# Patient Record
Sex: Female | Born: 1960 | Race: White | Hispanic: No | Smoking: Never smoker
Health system: Southern US, Community
[De-identification: ages and names within clinical notes are randomized; demographics above are authoritative.]

## PROBLEM LIST (undated history)

## (undated) DIAGNOSIS — I890 Lymphedema, not elsewhere classified: Secondary | ICD-10-CM

## (undated) DIAGNOSIS — L0390 Cellulitis, unspecified: Secondary | ICD-10-CM

## (undated) DIAGNOSIS — E039 Hypothyroidism, unspecified: Secondary | ICD-10-CM

## (undated) HISTORY — PX: ESOPHAGEAL DILATION: SHX303

## (undated) HISTORY — PX: TUBAL LIGATION: SHX77

## (undated) HISTORY — PX: OVARIAN CYST REMOVAL: SHX89

---

## 1998-01-29 ENCOUNTER — Encounter: Admission: RE | Admit: 1998-01-29 | Discharge: 1998-04-29 | Payer: Self-pay

## 1998-09-15 ENCOUNTER — Ambulatory Visit (HOSPITAL_COMMUNITY): Admission: RE | Admit: 1998-09-15 | Discharge: 1998-09-15 | Payer: Self-pay | Admitting: Gastroenterology

## 1998-09-15 ENCOUNTER — Encounter: Payer: Self-pay | Admitting: Gastroenterology

## 2002-02-23 ENCOUNTER — Other Ambulatory Visit: Admission: RE | Admit: 2002-02-23 | Discharge: 2002-02-23 | Payer: Self-pay | Admitting: *Deleted

## 2002-10-23 ENCOUNTER — Other Ambulatory Visit: Admission: RE | Admit: 2002-10-23 | Discharge: 2002-10-23 | Payer: Self-pay | Admitting: Family Medicine

## 2003-11-05 ENCOUNTER — Ambulatory Visit (HOSPITAL_COMMUNITY): Admission: RE | Admit: 2003-11-05 | Discharge: 2003-11-05 | Payer: Self-pay | Admitting: Gastroenterology

## 2004-09-25 ENCOUNTER — Ambulatory Visit (HOSPITAL_COMMUNITY): Admission: RE | Admit: 2004-09-25 | Discharge: 2004-09-25 | Payer: Self-pay | Admitting: Gastroenterology

## 2005-02-12 ENCOUNTER — Ambulatory Visit: Payer: Self-pay | Admitting: Internal Medicine

## 2005-03-03 ENCOUNTER — Other Ambulatory Visit: Admission: RE | Admit: 2005-03-03 | Discharge: 2005-03-03 | Payer: Self-pay | Admitting: Family Medicine

## 2005-03-08 ENCOUNTER — Ambulatory Visit (HOSPITAL_COMMUNITY): Admission: RE | Admit: 2005-03-08 | Discharge: 2005-03-08 | Payer: Self-pay | Admitting: Family Medicine

## 2006-08-03 ENCOUNTER — Encounter: Admission: RE | Admit: 2006-08-03 | Discharge: 2006-08-03 | Payer: Self-pay | Admitting: Family Medicine

## 2006-08-03 ENCOUNTER — Other Ambulatory Visit: Admission: RE | Admit: 2006-08-03 | Discharge: 2006-08-03 | Payer: Self-pay | Admitting: *Deleted

## 2007-07-28 ENCOUNTER — Ambulatory Visit (HOSPITAL_COMMUNITY): Admission: RE | Admit: 2007-07-28 | Discharge: 2007-07-28 | Payer: Self-pay | Admitting: Gastroenterology

## 2007-08-23 ENCOUNTER — Ambulatory Visit (HOSPITAL_COMMUNITY): Admission: RE | Admit: 2007-08-23 | Discharge: 2007-08-23 | Payer: Self-pay | Admitting: *Deleted

## 2008-01-17 ENCOUNTER — Other Ambulatory Visit: Admission: RE | Admit: 2008-01-17 | Discharge: 2008-01-17 | Payer: Self-pay | Admitting: *Deleted

## 2008-07-15 ENCOUNTER — Encounter: Admission: RE | Admit: 2008-07-15 | Discharge: 2008-07-15 | Payer: Self-pay | Admitting: Family Medicine

## 2008-07-18 ENCOUNTER — Encounter: Admission: RE | Admit: 2008-07-18 | Discharge: 2008-07-18 | Payer: Self-pay | Admitting: Family Medicine

## 2008-09-10 ENCOUNTER — Ambulatory Visit: Payer: Self-pay | Admitting: Vascular Surgery

## 2009-07-16 ENCOUNTER — Encounter: Admission: RE | Admit: 2009-07-16 | Discharge: 2009-07-16 | Payer: Self-pay | Admitting: Family Medicine

## 2009-07-25 ENCOUNTER — Encounter: Admission: RE | Admit: 2009-07-25 | Discharge: 2009-07-25 | Payer: Self-pay

## 2010-11-08 ENCOUNTER — Encounter: Payer: Self-pay | Admitting: Family Medicine

## 2011-03-02 NOTE — Consult Note (Signed)
VASCULAR SURGERY CONSULTATION   [name] [name], [name] [name]  DOB:  07/26/1961                                       09/10/2008  ZOXWR#:[id number]   The patient is a 50 year old female referred by Dr. Estill Bakes for  vascular surgery consultation regarding bilateral lower extremity edema.  This patient had 2 pregnancies 17 and 21 years ago, but only noticed  edema about 6 years ago, which started in her feet and was symmetrical  in nature.  It then progressed up into the calves and thighs.  She has  no history of deep vein thrombosis, thrombophlebitis, pulmonary  embolism, or any clotting problems.  She has been treated with elastic  compression stockings 2 years ago, fitted at a hospital supply store,  but these she stated were too tight, and she was not able to effectively  wear.  She has had diuretics tried without much success.  She tries to  elevate her legs at night on pillows.  Has not had a venous ultrasound,  she states.  There was some question that she might have lupus  erythematosus a few years ago, but that is not thought to be the case,  and she may have fibromyalgia.   PAST MEDICAL HISTORY:  1. Gastroesophageal reflux disease with history of esophageal      dilatations.  2. Negative for diabetes, hypertension, coronary artery disease, COPD,      or stroke, or hyperlipidemia.   PREVIOUS SURGERY:  Removal of ovarian cysts.   FAMILY HISTORY:  Positive for coronary artery disease.  Father having a  myocardial infarction at age 71.  Negative for CVA or diabetes.   SOCIAL HISTORY:  She is single and has 2 children.  She does not use  tobacco and drinks occasional wine.   REVIEW OF SYSTEMS:  Positive for weight gain, dyspnea on exertion,  negative for PND, orthopnea, or arrhythmias.  She has lower extremity  discomfort with walking and while in a supine position.  She has joint  and muscle pain with a rash to the lower extremities.   ALLERGIES:  None known.   PHYSICAL EXAM:  Blood pressure 139/88, heart rate 63, respirations 14.  Generally, she is a middle-aged female with obvious diffuse edema to  both lower extremities.  She is alert and oriented x3.  Neck is supple  with 3+ carotid pulses palpable.  No bruits are audible.  Neurologic  exam is normal.  No palpable adenopathy in the neck.  Chest is clear to  auscultation.  Cardiovascular exam reveals a regular rhythm with no  murmurs.  Her abdomen is soft and nontender with no masses.  She has 3+  femoral, popliteal, and posterior tibial pulses bilaterally.  There is  2+ edema throughout both lower extremities with feet being quite puffy.  She has some mild erythema medially.  No skin breakdown is noted.  Some  very early hyperpigmentation is noted.  No varicosities are present.  Homan sign is negative.   Venous duplex exam reveals no evidence of deep vein obstruction in  either leg.  She does have gross reflux in the left great saphenous vein  throughout, but the right great saphenous vein appears normal.   I do not think the etiology of her swelling is venous, even though she  does have reflux throughout her left greater saphenous system.  The  edema is equally as bad on the right as the left, and I think this is  more of a lymphedema picture.  I stressed to her the importance of  elevating the foot of the bed, rather than sleeping on pillows, so that  she will get good reduction of edema during the night.  This could be  difficult because of her GERD.  We have also fit her for long-leg  elastic compression stockings, which must be applied early in the  morning prior to arising to get good results.  Diuretics may also be  beneficial.  Otherwise, I have no suggestions.  Hopefully, this will  help this nice lady.   Quita Skye Hart [place], M.D.  Electronically Signed  JDL/MEDQ  D:  09/10/2008  T:  09/11/2008  Job:  [id number]   cc:   Areatha Keas, M.D.

## 2011-03-02 NOTE — Op Note (Signed)
NAMEFERMINA, [name] [name]             ACCOUNT NO.:  [id number]   MEDICAL RECORD NO.:  [id number]          PATIENT TYPE:  AMB   LOCATION:  ENDO                         FACILITY:  [facility]   PHYSICIAN:  James L. Malon Kindle., M.D.DATE OF BIRTH:  26-Aug-1961   DATE OF PROCEDURE:  08/23/2007  DATE OF DISCHARGE:                               OPERATIVE REPORT   PROCEDURE:  Esophagogastroduodenoscopy with Savary dilatation.   MEDICATIONS:  Fentanyl 65 mcg, Versed 6 mg IV, Cetacaine spray.   INDICATIONS:  Patient with a previous history of esophageal stricture  dilated to 48-French 3 years ago.  This procedure is done due to  increasing dysphagia.   DESCRIPTION OF PROCEDURE:  The procedure was discussed again just before  we did the procedure.  The patient was well aware that there was a  slight risk of perforation and bleeding.  She has been taking Prilosec  but only intermittently.   DESCRIPTION OF PROCEDURE:  The procedure was performed using  fluoroscopic guidance in the endoscopy unit.  The Pentax scope was  inserted and advanced.  The distal esophagus was reached.  In the very  part of the distal esophagus was a slight stricture.  Scope easily  passed.  Complete endoscopy was performed, and no other findings were  seen.  We then passed the Savary guide wire through the scope on the  greater curvature of the stomach and withdrew the scope over the guide  wire.  The patient's neck was extended.  Using fluoroscopic guidance, a  12.8, 14, 15 and 16-French dilator were passed.  No heme was seen with  the first three dilators.  There was some slight resistance and a small  amount of heme with a 16-French dilator.  The patient tolerated the  procedure well.  There were no immediate problems.   ASSESSMENT:  Esophageal dilatation to 16 mm.   PLAN:  Will follow the patient clinically and routine instructions.  Clear liquids for 4 to 5 hours.  Will start empirically on omeprazole 20  mg  daily.           ______________________________  Llana Aliment Malon Kindle., M.D.     Waldron Session  D:  08/23/2007  T:  08/23/2007  Job:  [id number]   cc:   Dario Guardian, M.D.  James L. Malon Kindle., M.D.

## 2011-03-02 NOTE — Op Note (Signed)
NAME:  [name] [name], ROEN             ACCOUNT NO.:  [id number]   MEDICAL RECORD NO.:  [id number]          PATIENT TYPE:  AMB   LOCATION:  ENDO                         FACILITY:  [facility]   PHYSICIAN:  Graylin Shiver, M.D.   DATE OF BIRTH:  1961-02-05   DATE OF PROCEDURE:  07/28/2007  DATE OF DISCHARGE:                               OPERATIVE REPORT   ESOPHAGOGASTRODUODENOSCOPY WITH ESOPHAGEAL FOREIGN BODY REMOVAL:   INDICATIONS FOR PROCEDURE:  The patient is a 50 year old female who has  a history of a distal esophageal stricture, which has been dilated in  the past.  She has also had a foreign body removed in the past.  The  patient was eating steak last night and got a piece of steak caught in  her esophagus.  She has been unable to swallow since.  She came to the  endoscopy unit at [facility] as an outpatient for endoscopy with meat  impaction removal.   Informed consent was obtained after explanation of risks of bleeding,  infection and perforation.   PREMEDICATION:  Fentanyl 50 mcg IV, Versed 5 mg IV.   PROCEDURE:  With the patient in the left lateral decubitus position, the  Pentax gastroscope was inserted into the oropharynx and passed into the  esophagus.  It was advanced down the esophagus to the lower portion of  the esophagus.  At this point a meat impaction was noted.  I initially  attempted to grasp this with a snare, but it had been there so long that  it fragmented and the meat impaction just seemed to flake apart.  I  therefore inserted a Lucina Mellow retrieval net and was able to grasp a portion  of the meat impaction and remove it.  I then had to reinsert the scope a  couple of times to finally get it all but was able to get all of the  meat impaction removed.  The scope was passed then down into the  duodenum.  The second portion and bulb of the duodenum looked normal.  The stomach had normal-appearing mucosa.  No lesions were seen on  retroflexion.  There was a hiatal  hernia.  The distal esophagus within  where the meat had been impacted for the past day was very irritated-  appearing, compatible with marked distal esophagitis.  There was a  distal esophageal stricture noted at 34 cm but this was not severe and  did not prevent passage of the scope.  She tolerated the procedure well  without complications.   IMPRESSION:  1. Meat impaction.  2. Hiatal hernia.  3. Distal esophageal stricture.   PLAN:  I would recommend that this patient be on a proton pump inhibitor  for the next few weeks.  I will recommend to her that she take Prilosec  20 mg daily.  I would then have her follow up as an outpatient with Dr.  [name] for repeat endoscopy with dilation.           ______________________________  Graylin Shiver, M.D.     SFG/MEDQ  D:  07/28/2007  T:  07/29/2007  Job:  [id number]   cc:   Fayrene Fearing L. Malon Kindle., M.D.  Fax: [phone]

## 2011-03-02 NOTE — Procedures (Signed)
DUPLEX DEEP VENOUS EXAM - LOWER EXTREMITY   INDICATION:  Bilateral lower extremity edema.   HISTORY:  Edema:  Yes.  Trauma/Surgery:  No.  Pain:  Yes.  PE:  No.  Previous DVT:  No.  Anticoagulants:  Other:   DUPLEX EXAM:                CFV   SFV   PopV  PTV    GSV                R  L  R  L  R  L  R   L  R  L  Thrombosis    0  0  0  0  0  0  0   0  0  0  Spontaneous   +  +  +  +  +  +  +   +  +  +  Phasic        +  +  +  +  +  +  +   +  +  +  Augmentation  +  +  +  +  +  +  +   +  +  +  Compressible  +  +  +  +  +  +  +   +  +  +  Competent     +  +  +  +  +  +  +   +  +  +   Legend:  + - yes  o - no  p - partial  D - decreased   IMPRESSION:  1. No evidence of DVT noted in bilateral legs.  2. Reflux noted in the entire left greater saphenous vein.    _____________________________  Quita Skye. Hart [place], M.D.   MG/MEDQ  D:  09/10/2008  T:  09/10/2008  Job:  [id number]

## 2011-03-05 NOTE — Op Note (Signed)
[name], [name]             ACCOUNT NO.:  [id number]   MEDICAL RECORD NO.:  [id number]          PATIENT TYPE:  AMB   LOCATION:  ENDO                         FACILITY:  [facility]   PHYSICIAN:  James L. Malon Kindle., M.D.DATE OF BIRTH:  Jun 09, 1961   DATE OF PROCEDURE:  09/25/2004  DATE OF DISCHARGE:                                 OPERATIVE REPORT   PROCEDURE:  Esophagogastroduodenoscopy with Savary dilatation.   MEDICATIONS GIVEN:  Fentanyl 50 mcg, Versed 6 mg IV, Cetacaine spray.   INDICATIONS FOR PROCEDURE:  The patient has had an esophageal stricture for  sometime, this has been getting progressively worse.  This was demonstrated  initially back in 1998 but has been getting worse recently.   DESCRIPTION OF PROCEDURE:  The procedure was explained to the patient in the  office including the risks and benefits.  I did go over the risks with her  again just prior to her sedation.  The scope was inserted and advanced.  The  stomach and pylorus were passed.  The duodenum including the bulb and second  duodenum were normal.  The scope was withdrawn back into the stomach.  The  fundus and cardia were seen and were normal.  The patient had approximately  a 3-4 cm hiatal hernia and there was a stricture right at the junction of  the hiatal hernia and esophagus.  We then went back into the stomach, placed  the Savary guide-wire under fluoroscopic guidance, the patient's neck was  extended, we passed a 39, 42, 45, and 48 Savary dilators.  There was  resistance and heme with the 48 dilator.  The dilator and guide-wire were  withdrawn.  The patient tolerated the procedure well, there were no  immediate complications.   ASSESSMENT:  Esophageal stricture dilated to 48 [place].   PLAN:  Routine post dilatation orders, recommend Prilosec OTC daily, and see  back in the office in 4-6 weeks.       JLE/MEDQ  D:  09/25/2004  T:  09/25/2004  Job:  [id number]   cc:   Meredith Staggers, M.D.  [address], [address]  [place]  [place] [address]  Fax: [phone]

## 2011-03-05 NOTE — Op Note (Signed)
NAME:  [name] [name], [name] [name]                       ACCOUNT NO.:  [id number]   MEDICAL RECORD NO.:  [id number]                   PATIENT TYPE:  AMB   LOCATION:  ENDO                                 FACILITY:  [facility]   PHYSICIAN:  Bernette Redbird, M.D.                DATE OF BIRTH:  11/14/1960   DATE OF PROCEDURE:  11/05/2003  DATE OF DISCHARGE:                                 OPERATIVE REPORT   PROCEDURE:  Upper endoscopy with removal of foreign body.   INDICATIONS:  This is a 50 year old female with a long-standing history of  esophageal dysphagia symptoms status post a previous food impaction about  ten years ago with her most recent esophageal dilatation performed by Dr.  [name] about six years ago.   In the past month or so, she has had quite a bit of severe heartburn for  which she has been using over-the-counter remedies.  She has also been  having problems with very transient food impactions.  However, last night  while eating steak, a piece of food got caught in her esophagus and could  not be dislodged either way and has persisted up tot he present time.   FINDINGS:  Successful removal of food impaction.  Mild distal esophagitis  above small hiatal hernia.  Probable esophageal ring, but unable to  visualize discretely.   PROCEDURE:  The risks of the procedure were reviewed briefly with the  patient and she provided written consent, having coming as an outpatient to  the endoscopy unit.  Sedation was Fentanyl 100 mg and Versed 10 mg IV prior  to and during the course of the procedure without clinical instability.  The  Olympus standard videoendoscope was passed under direct vision.  The vocal  cords looked entirely normal.  The esophagus was readily entered.  The  proximal esophageal mucosa was normal.  Distally there was a bolus of meat  impacted in the distal esophagus.  Attempts to engage this with the [facility]  retrieval basket were unsuccessful because it was so tightly wedged  in the  esophagus I could not get the basket around it.  I then attempted to use the  snare but encountered the same problem.  I then went to the tripod forceps  and removed approximately 4 or 5 pieces of the meat, which would pull off  each time the forceps were engaged.  Each time none of these were removed  out through the patient's mouth so the patient was re-endoscoped under  direct vision.  Finally, when I went down the last time, the remaining food  bolus had slipped spontaneously into the stomach where a large bolus of meat  was noted.  The distal esophageal mucosa was slightly inflamed although no  frank erosions or ulcerations were observed.  This was felt to be consistent  with stasis esophagitis.  I did see, grasp very transiently, an esophageal  ring but no persistent or  well-defined ring could be visualized.  There was  a good deal of distal esophageal spasm, and hence if the ring was present it  would have been hard to visualize.   There was a small hiatal hernia present.   The gastric mucosa was unremarkable, without evidence of gastritis,  erosions, ulcers, problems or masses including retroflexed view of the  cardia, which showed the small hiatal hernia from its inferior perspective.  The pylorus, duodenal bulb and second duodenum looked normal.   The scope was removed from the patient.  She tolerated the procedure well  and there were no apparent complications.   IMPRESSION:  1. Successful removal of esophageal foreign body (food impaction) (935.1).  2. Mild distal esophagitis.  3. Small hiatal hernia and probable esophageal ring.  4. Reflux symptomatology, without significant reflux esophagitis being     identified.   PLAN:  1. Initiate Prevacid Solu-Tabs once a day (samples to be provided).  2. Office followup with Dr. [name] in the next several weeks, at which time     a decision regarding ongoing medical therapy and/or esophageal dilatation     could be  made.                                               Bernette Redbird, M.D.    RB/MEDQ  D:  11/05/2003  T:  11/06/2003  Job:  [id number]   cc:   Fayrene Fearing L. Malon Kindle., M.D.  [address], [address]  [place]  [place] [address]  Fax: [phone]   Meredith Staggers, M.D.  [address], [address]  [place]  [place] [address]  Fax: [phone]

## 2011-07-22 ENCOUNTER — Other Ambulatory Visit: Payer: Self-pay | Admitting: Internal Medicine

## 2011-07-22 DIAGNOSIS — Z1231 Encounter for screening mammogram for malignant neoplasm of breast: Secondary | ICD-10-CM

## 2011-07-29 ENCOUNTER — Other Ambulatory Visit (HOSPITAL_COMMUNITY)
Admission: RE | Admit: 2011-07-29 | Discharge: 2011-07-29 | Disposition: A | Discharge status: Home / Self Care | Payer: Managed Care, Other (non HMO) | Source: Ambulatory Visit | Attending: Internal Medicine | Admitting: Internal Medicine

## 2011-07-29 DIAGNOSIS — Z01419 Encounter for gynecological examination (general) (routine) without abnormal findings: Secondary | ICD-10-CM | POA: Insufficient documentation

## 2011-08-03 ENCOUNTER — Ambulatory Visit
Admission: RE | Admit: 2011-08-03 | Discharge: 2011-08-03 | Disposition: A | Discharge status: Home / Self Care | Payer: Managed Care, Other (non HMO) | Source: Ambulatory Visit | Attending: Internal Medicine | Admitting: Internal Medicine

## 2011-08-03 DIAGNOSIS — Z1231 Encounter for screening mammogram for malignant neoplasm of breast: Secondary | ICD-10-CM

## 2012-03-15 ENCOUNTER — Other Ambulatory Visit: Payer: Self-pay | Admitting: Internal Medicine

## 2012-03-20 ENCOUNTER — Ambulatory Visit
Admission: RE | Admit: 2012-03-20 | Discharge: 2012-03-20 | Disposition: A | Discharge status: Home / Self Care | Payer: Managed Care, Other (non HMO) | Source: Ambulatory Visit | Attending: Internal Medicine | Admitting: Internal Medicine

## 2012-03-22 ENCOUNTER — Other Ambulatory Visit: Payer: Managed Care, Other (non HMO)

## 2012-03-23 ENCOUNTER — Other Ambulatory Visit: Payer: Self-pay | Admitting: Gastroenterology

## 2012-03-23 DIAGNOSIS — R1013 Epigastric pain: Secondary | ICD-10-CM

## 2012-03-23 DIAGNOSIS — R110 Nausea: Secondary | ICD-10-CM

## 2012-03-27 ENCOUNTER — Other Ambulatory Visit (HOSPITAL_COMMUNITY): Payer: Managed Care, Other (non HMO)

## 2012-03-27 ENCOUNTER — Ambulatory Visit (INDEPENDENT_AMBULATORY_CARE_PROVIDER_SITE_OTHER): Payer: Managed Care, Other (non HMO) | Admitting: Surgery

## 2012-03-31 ENCOUNTER — Encounter (HOSPITAL_COMMUNITY)
Admission: RE | Admit: 2012-03-31 | Discharge: 2012-03-31 | Disposition: A | Discharge status: Home / Self Care | Payer: Managed Care, Other (non HMO) | Source: Ambulatory Visit | Attending: Gastroenterology | Admitting: Gastroenterology

## 2012-03-31 DIAGNOSIS — R1013 Epigastric pain: Secondary | ICD-10-CM | POA: Insufficient documentation

## 2012-03-31 DIAGNOSIS — R110 Nausea: Secondary | ICD-10-CM

## 2012-03-31 MED ORDER — TECHNETIUM TC 99M MEBROFENIN IV KIT
5.5000 | PACK | Freq: Once | INTRAVENOUS | Status: AC | PRN
Start: 2012-03-31 — End: 2012-03-31
  Administered 2012-03-31: 6 via INTRAVENOUS

## 2012-09-25 ENCOUNTER — Other Ambulatory Visit: Payer: Self-pay | Admitting: Internal Medicine

## 2012-09-25 DIAGNOSIS — Z1231 Encounter for screening mammogram for malignant neoplasm of breast: Secondary | ICD-10-CM

## 2012-10-30 ENCOUNTER — Ambulatory Visit
Admission: RE | Admit: 2012-10-30 | Discharge: 2012-10-30 | Disposition: A | Discharge status: Home / Self Care | Payer: Managed Care, Other (non HMO) | Source: Ambulatory Visit | Attending: Internal Medicine | Admitting: Internal Medicine

## 2012-10-30 DIAGNOSIS — Z1231 Encounter for screening mammogram for malignant neoplasm of breast: Secondary | ICD-10-CM

## 2012-12-18 ENCOUNTER — Other Ambulatory Visit (HOSPITAL_COMMUNITY): Payer: Self-pay | Admitting: Internal Medicine

## 2012-12-18 ENCOUNTER — Ambulatory Visit (HOSPITAL_COMMUNITY)
Admission: RE | Admit: 2012-12-18 | Discharge: 2012-12-18 | Disposition: A | Discharge status: Home / Self Care | Payer: Managed Care, Other (non HMO) | Source: Ambulatory Visit | Attending: Internal Medicine | Admitting: Internal Medicine

## 2012-12-18 DIAGNOSIS — M25569 Pain in unspecified knee: Secondary | ICD-10-CM | POA: Insufficient documentation

## 2012-12-18 DIAGNOSIS — M25561 Pain in right knee: Secondary | ICD-10-CM

## 2012-12-18 DIAGNOSIS — M7989 Other specified soft tissue disorders: Secondary | ICD-10-CM

## 2012-12-18 DIAGNOSIS — R599 Enlarged lymph nodes, unspecified: Secondary | ICD-10-CM | POA: Insufficient documentation

## 2012-12-18 DIAGNOSIS — M79609 Pain in unspecified limb: Secondary | ICD-10-CM

## 2012-12-18 NOTE — Progress Notes (Signed)
Right:  No evidence of DVT, superficial thrombosis, or Baker's cyst.  Left:  Negative for DVT in the common femoral vein.  

## 2012-12-22 ENCOUNTER — Emergency Department (HOSPITAL_COMMUNITY)
Admission: EM | Admit: 2012-12-22 | Discharge: 2012-12-22 | Disposition: A | Discharge status: Home / Self Care | Payer: Managed Care, Other (non HMO) | Attending: Emergency Medicine | Admitting: Emergency Medicine

## 2012-12-22 ENCOUNTER — Encounter (HOSPITAL_COMMUNITY): Payer: Self-pay

## 2012-12-22 DIAGNOSIS — E876 Hypokalemia: Secondary | ICD-10-CM

## 2012-12-22 DIAGNOSIS — L02419 Cutaneous abscess of limb, unspecified: Secondary | ICD-10-CM | POA: Insufficient documentation

## 2012-12-22 DIAGNOSIS — Z79899 Other long term (current) drug therapy: Secondary | ICD-10-CM | POA: Insufficient documentation

## 2012-12-22 DIAGNOSIS — L0390 Cellulitis, unspecified: Secondary | ICD-10-CM

## 2012-12-22 DIAGNOSIS — Z792 Long term (current) use of antibiotics: Secondary | ICD-10-CM | POA: Insufficient documentation

## 2012-12-22 DIAGNOSIS — R609 Edema, unspecified: Secondary | ICD-10-CM | POA: Insufficient documentation

## 2012-12-22 DIAGNOSIS — D649 Anemia, unspecified: Secondary | ICD-10-CM | POA: Insufficient documentation

## 2012-12-22 DIAGNOSIS — L03119 Cellulitis of unspecified part of limb: Secondary | ICD-10-CM | POA: Insufficient documentation

## 2012-12-22 DIAGNOSIS — Z8679 Personal history of other diseases of the circulatory system: Secondary | ICD-10-CM | POA: Insufficient documentation

## 2012-12-22 LAB — BASIC METABOLIC PANEL
CO2: 26 mEq/L (ref 19–32)
Calcium: 9.2 mg/dL (ref 8.4–10.5)
Chloride: 102 mEq/L (ref 96–112)
Creatinine, Ser: 0.69 mg/dL (ref 0.50–1.10)
GFR calc Af Amer: >90 mL/min (ref >90)
GFR calc non Af Amer: >90 mL/min (ref >90)
Glucose, Bld: 99 mg/dL (ref 70–99)
Potassium: 2.8 mEq/L — ABNORMAL LOW (ref 3.5–5.1)
Sodium: 141 mEq/L (ref 135–145)

## 2012-12-22 LAB — CBC WITH DIFFERENTIAL/PLATELET
Basophils Absolute: 0.1 10*3/uL (ref 0.0–0.1)
Eosinophils Absolute: 0.4 10*3/uL (ref 0.0–0.7)
Eosinophils Relative: 6 % — ABNORMAL HIGH (ref 0–5)
Lymphocytes Relative: 19 % (ref 12–46)
Lymphs Abs: 1.4 10*3/uL (ref 0.7–4.0)
Monocytes Relative: 7 % (ref 3–12)
RBC: 4.51 MIL/uL (ref 3.87–5.11)
RDW: 16.4 % — ABNORMAL HIGH (ref 11.5–15.5)
WBC: 7.6 10*3/uL (ref 4.0–10.5)

## 2012-12-22 MED ORDER — HYDROCODONE-ACETAMINOPHEN 5-325 MG PO TABS: 2.0000 | ORAL_TABLET | ORAL | Status: DC | PRN

## 2012-12-22 MED ORDER — POTASSIUM CHLORIDE CRYS ER 20 MEQ PO TBCR
40.0000 meq | EXTENDED_RELEASE_TABLET | Freq: Once | ORAL | Status: AC
  Administered 2012-12-22: 40 meq via ORAL
  Filled 2012-12-22: qty 2

## 2012-12-22 MED ORDER — SULFAMETHOXAZOLE-TRIMETHOPRIM 800-160 MG PO TABS: 1.0000 | ORAL_TABLET | Freq: Two times a day (BID) | ORAL | Status: DC

## 2012-12-22 MED ORDER — NAPROXEN 500 MG PO TABS: 500.0000 mg | ORAL_TABLET | Freq: Two times a day (BID) | ORAL | Status: DC

## 2012-12-22 MED ORDER — POTASSIUM CHLORIDE CRYS ER 20 MEQ PO TBCR: 20.0000 meq | EXTENDED_RELEASE_TABLET | Freq: Every day | ORAL | Status: DC

## 2012-12-22 MED ORDER — VANCOMYCIN HCL IN DEXTROSE 1-5 GM/200ML-% IV SOLN
1000.0000 mg | Freq: Once | INTRAVENOUS | Status: AC
  Administered 2012-12-22: 1000 mg via INTRAVENOUS
  Filled 2012-12-22: qty 200

## 2012-12-22 NOTE — ED Provider Notes (Signed)
Medical screening examination/treatment/procedure(s) were conducted as a shared visit with non-physician practitioner(s) and myself.  I personally evaluated the patient during the encounter  Please see my separate respective documentation pertaining to this patient encounter   Brian D Miller, MD 12/22/12 2013 

## 2012-12-22 NOTE — ED Notes (Signed)
Pt here due to worsening cellulitis in right leg. Has been on antibiotics without improvement. States leg is twice the size as it was two days ago. Redness and swelling noted to right leg. No fever.

## 2012-12-22 NOTE — ED Notes (Signed)
Rt. Lower leg became swollen and red on Monday, pt. Came to [place] and was negative for blood clot. She was placed on Augmentin and pt. Believes the leg is becoming worse.  Leg is swollen and pink, and tight It is not warm to touch,  Pt. Did take ibuprofen this morning at 0700,

## 2012-12-22 NOTE — ED Provider Notes (Signed)
History     CSN: [id number]  Arrival date & time 12/22/12  1218   First MD Initiated Contact with Patient 12/22/12 1236      Chief Complaint  Patient presents with  . Recurrent Skin Infections    (Consider location/radiation/quality/duration/timing/severity/associated sxs/prior treatment) HPI Comments: Pt is a 52 y/o Caucasian F, with PMHx of lymphedema, c/o swelling and discomfort to right leg x 5 days. Pt describes pain as an achy, constant pain, rating pain 6-7/10, that has progressively gotten worse with radiation to right inner thigh, nothing makes the pain better or worse.  Pt reported that swelling began 5 days ago with redness beginning 4 days ago.  Pt denies risk factors for DVT. Denies ever having an episode like this. Pt reported having an ultrasound performed to right leg. I have read the results of the tests, no blood clots were found.     The history is provided by the patient.    History reviewed. No pertinent past medical history.  History reviewed. No pertinent past surgical history.  No family history on file.  History  Substance Use Topics  . Smoking status: Never Smoker   . Smokeless tobacco: Not on file  . Alcohol Use: Yes    OB History   Grav Para Term Preterm Abortions TAB SAB Ect Mult Living                  Review of Systems  Skin: Positive for color change.       Swelling and inflammation to right lower leg  All other systems reviewed and are negative.    Allergies  Spinach  Home Medications   Current Outpatient Rx  Name  Route  Sig  Dispense  Refill  . amoxicillin-clavulanate (AUGMENTIN) 875-125 MG per tablet   Oral   Take 1 tablet by mouth 2 (two) times daily.         . Cholecalciferol (VITAMIN D) 2000 UNITS tablet   Oral   Take 4,000 Units by mouth daily.         . furosemide (LASIX) 40 MG tablet   Oral   Take 40 mg by mouth daily.         Marland Kitchen ibuprofen (ADVIL,MOTRIN) 200 MG tablet   Oral   Take 400-600 mg by mouth  every 6 (six) hours as needed for pain.         Marland Kitchen omeprazole (PRILOSEC) 20 MG capsule   Oral   Take 20 mg by mouth daily.         . theophylline (THEODUR) 100 MG 12 hr tablet   Oral   Take 100 mg by mouth daily.         Marland Kitchen thyroid (ARMOUR) 120 MG tablet   Oral   Take 120 mg by mouth once a week. Take 1 tablet on sunday         . thyroid (ARMOUR) 90 MG tablet   Oral   Take 90 mg by mouth daily. Takes 90mg  tablet once daily Monday through saturday         . HYDROcodone-acetaminophen (NORCO/VICODIN) 5-325 MG per tablet   Oral   Take 2 tablets by mouth every 4 (four) hours as needed for pain.   10 tablet   0   . naproxen (NAPROSYN) 500 MG tablet   Oral   Take 1 tablet (500 mg total) by mouth 2 (two) times daily with a meal.   30 tablet   0   .  potassium chloride SA (K-DUR,KLOR-CON) 20 MEQ tablet   Oral   Take 1 tablet (20 mEq total) by mouth daily.   10 tablet   0   . sulfamethoxazole-trimethoprim (BACTRIM DS,SEPTRA DS) 800-160 MG per tablet   Oral   Take 1 tablet by mouth 2 (two) times daily. One po bid x 10 days   20 tablet   0     BP 156/91  Pulse 89  Temp(Src) 97.5 F (36.4 C) (Oral)  SpO2 100%  LMP 12/17/2012  Physical Exam  Constitutional: She appears well-developed and well-nourished.  Cardiovascular: Normal rate, regular rhythm and normal heart sounds.   No murmur heard. 2+ pitting edema to knee on right lower leg Palpable peripheral pulses b/l   Pulmonary/Chest: Effort normal and breath sounds normal.  Musculoskeletal: Normal range of motion.  Asymmetry to lower legs - circumference of right lower leg greater than left lower leg Normal sensation b/l  Skin: Skin is warm. There is erythema.     Circumferential erythema, swelling, inflammation, and warmth to right lower leg from ankle to just below knee.       ED Course  Procedures (including critical care time)  Labs Reviewed  CBC WITH DIFFERENTIAL - Abnormal; Notable for the  following:    Hemoglobin 10.8 (*)    HCT 33.9 (*)    MCV 75.2 (*)    MCH 23.9 (*)    RDW 16.4 (*)    Eosinophils Relative 6 (*)    All other components within normal limits  BASIC METABOLIC PANEL - Abnormal; Notable for the following:    Potassium 2.8 (*)    All other components within normal limits   No results found.   1. Cellulitis   2. Hypokalemia   3. Anemia       MDM  Pt diagnosed with cellulitis infection to right lower leg.  Pt started on IV 1 gram Vancomycin in ED.  Pt is normotensive, non-tachycardic, alert and oriented x 3, afebrile, with happy affect. WBC within normal limits.  Spoke with pt and pt's primary care physician about plan of care, both parties agreed and understood. Outlined cellulitis with pen so pt is able to keep track of infection and knows to return to ED if infection is to worsen.           Raymon Mutton, PA-C 12/22/12 1555

## 2012-12-22 NOTE — ED Provider Notes (Signed)
Pt with hx of RLE pain that started on Monday - had been evaluated by her family doctor, Dr. Selena Batten. He had ordered a ultrasound which showed no signs of deep venous thrombosis. On Wednesday she noticed that the leg was more swollen, it is the continued to swell and become red and hot.  On exam the patient has tenderness, swelling, asymmetry with right greater than left lower extremity. It is warm to the touch, it extends over the majority of the right lower extremity below the knee and above the ankle. He does have a circumferential nature to it. The patient has clear heart and lung sounds, no tachycardia, she is tolerating by mouth and is not appear systemically ill. We'll obtain blood work as baseline blood counts and kidney function, gram of vancomycin, the patient feels well and agrees with going home to followup with Dr. Selena Batten or to return should her symptoms worsen. Cellulitic area has been on prior to discharge.  Medical screening examination/treatment/procedure(s) were conducted as a shared visit with non-physician practitioner(s) and myself.  I personally evaluated the patient during the encounter    Vida Roller, MD 12/22/12 [phone]

## 2012-12-22 NOTE — ED Notes (Signed)
Pt given discharge paperwork; no additional questions by pt about discharge; pt verbalized understanding of f/u, d/c and medications; VSS; pt ambulatory on discharge

## 2013-01-05 ENCOUNTER — Emergency Department (HOSPITAL_COMMUNITY)
Admission: EM | Admit: 2013-01-05 | Discharge: 2013-01-06 | Disposition: A | Discharge status: Home / Self Care | Payer: Managed Care, Other (non HMO) | Attending: Emergency Medicine | Admitting: Emergency Medicine

## 2013-01-05 ENCOUNTER — Encounter (HOSPITAL_COMMUNITY): Payer: Self-pay | Admitting: *Deleted

## 2013-01-05 DIAGNOSIS — L0390 Cellulitis, unspecified: Secondary | ICD-10-CM

## 2013-01-05 DIAGNOSIS — L02419 Cutaneous abscess of limb, unspecified: Secondary | ICD-10-CM | POA: Insufficient documentation

## 2013-01-05 DIAGNOSIS — Z862 Personal history of diseases of the blood and blood-forming organs and certain disorders involving the immune mechanism: Secondary | ICD-10-CM | POA: Insufficient documentation

## 2013-01-05 DIAGNOSIS — Z79899 Other long term (current) drug therapy: Secondary | ICD-10-CM | POA: Insufficient documentation

## 2013-01-05 DIAGNOSIS — Z8639 Personal history of other endocrine, nutritional and metabolic disease: Secondary | ICD-10-CM | POA: Insufficient documentation

## 2013-01-05 DIAGNOSIS — E039 Hypothyroidism, unspecified: Secondary | ICD-10-CM | POA: Insufficient documentation

## 2013-01-05 DIAGNOSIS — R21 Rash and other nonspecific skin eruption: Secondary | ICD-10-CM | POA: Insufficient documentation

## 2013-01-05 HISTORY — DX: Lymphedema, not elsewhere classified: I89.0

## 2013-01-05 HISTORY — DX: Hypothyroidism, unspecified: E03.9

## 2013-01-05 LAB — BASIC METABOLIC PANEL
BUN: 15 mg/dL (ref 6–23)
CO2: 24 mEq/L (ref 19–32)
Calcium: 9.3 mg/dL (ref 8.4–10.5)
Creatinine, Ser: 1.02 mg/dL (ref 0.50–1.10)

## 2013-01-05 LAB — CBC WITH DIFFERENTIAL/PLATELET
Basophils Absolute: 0.1 10*3/uL (ref 0.0–0.1)
Basophils Relative: 1 % (ref 0–1)
Eosinophils Relative: 8 % — ABNORMAL HIGH (ref 0–5)
HCT: 35 % — ABNORMAL LOW (ref 36.0–46.0)
Hemoglobin: 11.1 g/dL — ABNORMAL LOW (ref 12.0–15.0)
MCHC: 31.7 g/dL (ref 30.0–36.0)
MCV: 76.6 fL — ABNORMAL LOW (ref 78.0–100.0)
Monocytes Absolute: 0.5 10*3/uL (ref 0.1–1.0)
Monocytes Relative: 7 % (ref 3–12)
RDW: 17 % — ABNORMAL HIGH (ref 11.5–15.5)

## 2013-01-05 NOTE — ED Notes (Signed)
Pt c/o right lower leg swelling and reddness, recently hospitalized for cellulitis.  Seen at PCP on Monday and was given po abx.  Tonight, swelling is worsened and leg is painful

## 2013-01-06 MED ORDER — CLINDAMYCIN HCL 150 MG PO CAPS: ORAL_CAPSULE | ORAL | Status: DC

## 2013-01-06 MED ORDER — POTASSIUM CHLORIDE CRYS ER 20 MEQ PO TBCR: 40.0000 meq | EXTENDED_RELEASE_TABLET | Freq: Once | ORAL | Status: DC

## 2013-01-06 NOTE — ED Notes (Signed)
Rx x 1.  Pt voiced understanding to f/u with PCP on Monday, start new rx and d/c Bactrim, and return for worsening condition.

## 2013-01-06 NOTE — ED Provider Notes (Signed)
History     CSN: [id number]  Arrival date & time 01/05/13  2112   First MD Initiated Contact with Patient 01/05/13 2332      Chief Complaint  Patient presents with  . Cellulitis    (Consider location/radiation/quality/duration/timing/severity/associated sxs/prior treatment) HPI Comments: Patient is a 52 y/o F with reoccurring cellulitis infection. Patient reported that pain, increased swelling, and inflammation started on Wednesday (01/03/2013). Patient reported that skin has gotten "shiny," has a scaly appearance to right leg. Patient described that discomfort is a mild dull, achy pain that is constant with radiation up the right leg. Patient reported seeing Dr. Selena Batten on Monday (01/01/2013) and Dr. Selena Batten prescribed her Bactrim. Denied fever, chills, weeping to right leg, chest pain, shortness of breathe, calf pain.    The history is provided by the patient.    Past Medical History  Diagnosis Date  . Lymphedema   . Hypothyroid     Past Surgical History  Procedure Laterality Date  . Ovarian cyst removal      History reviewed. No pertinent family history.  History  Substance Use Topics  . Smoking status: Never Smoker   . Smokeless tobacco: Not on file  . Alcohol Use: Yes    OB History   Grav Para Term Preterm Abortions TAB SAB Ect Mult Living                  Review of Systems  Constitutional: Negative for fever and chills.  Respiratory: Negative for chest tightness and shortness of breath.   Cardiovascular: Negative for chest pain.  Gastrointestinal: Negative for nausea and abdominal pain.  Musculoskeletal: Negative for back pain.  Skin: Positive for color change and rash.       Swelling and inflammation to right leg.  10 Systems reviewed and are negative for acute change except as noted in the HPI.   Allergies  Spinach  Home Medications   Current Outpatient Rx  Name  Route  Sig  Dispense  Refill  . amoxicillin-clavulanate (AUGMENTIN) 875-125 MG per tablet  Oral   Take 1 tablet by mouth 2 (two) times daily.         . Cholecalciferol (VITAMIN D) 2000 UNITS tablet   Oral   Take 4,000 Units by mouth daily.         . furosemide (LASIX) 40 MG tablet   Oral   Take 40 mg by mouth daily.         Marland Kitchen ibuprofen (ADVIL,MOTRIN) 200 MG tablet   Oral   Take 400-600 mg by mouth every 6 (six) hours as needed for pain.         . naproxen (NAPROSYN) 500 MG tablet   Oral   Take 1 tablet (500 mg total) by mouth 2 (two) times daily with a meal.   30 tablet   0   . omeprazole (PRILOSEC) 20 MG capsule   Oral   Take 20 mg by mouth daily.         Marland Kitchen sulfamethoxazole-trimethoprim (BACTRIM DS) 800-160 MG per tablet   Oral   Take 1 tablet by mouth 2 (two) times daily. Additional 7 day course for lower extremity cellulitis, beginning 01/01/13         . theophylline (THEODUR) 100 MG 12 hr tablet   Oral   Take 100 mg by mouth daily.         Marland Kitchen thyroid (ARMOUR) 120 MG tablet   Oral   Take 120 mg by mouth once a  week. Take 1 tablet on sunday         . thyroid (ARMOUR) 90 MG tablet   Oral   Take 90 mg by mouth daily. Takes 90mg  tablet once daily Monday through saturday         . clindamycin (CLEOCIN) 150 MG capsule      Takes 2 (two) tablets by mouth 3 (three) times daily for 10 days.   60 capsule   0   . HYDROcodone-acetaminophen (NORCO/VICODIN) 5-325 MG per tablet   Oral   Take 2 tablets by mouth every 4 (four) hours as needed for pain.   10 tablet   0   . potassium chloride SA (K-DUR,KLOR-CON) 20 MEQ tablet   Oral   Take 1 tablet (20 mEq total) by mouth daily.   10 tablet   0   . sulfamethoxazole-trimethoprim (BACTRIM DS,SEPTRA DS) 800-160 MG per tablet   Oral   Take 1 tablet by mouth 2 (two) times daily. One po bid x 10 days   20 tablet   0     BP 140/74  Pulse 76  Temp(Src) 97.5 F (36.4 C) (Oral)  Resp 16  SpO2 100%  LMP 12/17/2012  Physical Exam  Nursing note and vitals reviewed. Constitutional: She appears  well-developed and well-nourished. No distress.  Neck: Normal range of motion. Neck supple.  Cardiovascular: Normal rate, regular rhythm, normal heart sounds and intact distal pulses.  Exam reveals no friction rub.   No murmur heard. Peripheral pulses palpable  Pulmonary/Chest: Effort normal and breath sounds normal. She has no wheezes. She has no rales.  Abdominal: Soft. Bowel sounds are normal. She exhibits no distension. There is no tenderness.  Musculoskeletal: Normal range of motion. She exhibits tenderness.  Tenderness upon palpation to the right leg Full ROM to right hip, knee, and ankle Sensation intact b/l.  Neurological: She is alert.  Skin: Skin is warm and dry. She is not diaphoretic. There is erythema.  Increased warmth to the touch of right leg. Swelling to right leg from just below the knee to right foot. Erythema circumferential, but increased on posterior aspect of right leg. 2+ pitting edema from right dorsum of foot to just below the knee. Dry, scales found circumferential to lower portion of right leg, below knee to above ankle.   Psychiatric: She has a normal mood and affect. Her behavior is normal.    ED Course  Procedures (including critical care time)  Labs Reviewed  CBC WITH DIFFERENTIAL - Abnormal; Notable for the following:    Hemoglobin 11.1 (*)    HCT 35.0 (*)    MCV 76.6 (*)    MCH 24.3 (*)    RDW 17.0 (*)    Eosinophils Relative 8 (*)    All other components within normal limits  BASIC METABOLIC PANEL - Abnormal; Notable for the following:    Potassium 3.4 (*)    Glucose, Bld 104 (*)    GFR calc non Af Amer 63 (*)    GFR calc Af Amer 73 (*)    All other components within normal limits   Filed Vitals:   01/05/13 2119  BP: 140/74  Pulse: 76  Temp: 97.5 F (36.4 C)  Resp: 16     1. Cellulitis       MDM  Patient is a 52 y/o patient with recurrent cellulitis. Patient was seen by Dr. Selena Batten on Monday (01/03/2013) and patient reported that the  swelling and inflammation to the right leg has  gotten worse. Patient has h/o cellulitis, was seen in ED on 12/22/2012. Denied fever, chills, shortness of breath, weeping, chest pain.   I personally evaluated and examined the patient. Discussed case with Dr. Theodoro Kalata. PE: Warm. Swelling and erythema to right leg - circumferential. Dry, scale appearance to right leg from below the knee to ankle. 2+ pitting edema. Pain upon palpation to right leg. Full ROM, Sensation intact.  Patient received doppler of right lower extremity on 12/18/2012 - no blood clots found. Denied calf pain, r/o DVT.  Potassium level low (3.4) - Potassium 40 mEq given orally.  Patient afebrile, normotensive, non-tachycardic, alert, happy affect. Patient is aseptic, non-toxic appearing. Diagnosed with cellulitis. Discharged with Clindamycin - discussed with patient to stop taking Bactrim. Discussed with patient to follow-up with Dr. Selena Batten on Monday (01/08/2013). Outlined cellulitis line with pen - instructed patient to monitor infection. Discussed with patient that if symptoms are to worsen to report back to the ED. Gave note for patient for work.  Patient agreed to plan, understood, all questions answered.           Raymon Mutton, PA-C 01/06/13 [phone]

## 2013-01-07 NOTE — ED Provider Notes (Signed)
Medical screening examination/treatment/procedure(s) were performed by non-physician practitioner and as supervising physician I was immediately available for consultation/collaboration.  Sunnie Nielsen, MD 01/07/13 [phone]

## 2013-10-24 ENCOUNTER — Other Ambulatory Visit (HOSPITAL_COMMUNITY): Payer: Self-pay | Admitting: Internal Medicine

## 2013-10-24 ENCOUNTER — Ambulatory Visit (HOSPITAL_COMMUNITY)
Admission: RE | Admit: 2013-10-24 | Discharge: 2013-10-24 | Disposition: A | Discharge status: Home / Self Care | Payer: BC Managed Care – PPO | Source: Ambulatory Visit | Attending: Internal Medicine | Admitting: Internal Medicine

## 2013-10-24 ENCOUNTER — Other Ambulatory Visit: Payer: Self-pay | Admitting: Internal Medicine

## 2013-10-24 DIAGNOSIS — L02419 Cutaneous abscess of limb, unspecified: Secondary | ICD-10-CM | POA: Insufficient documentation

## 2013-10-24 DIAGNOSIS — M7989 Other specified soft tissue disorders: Secondary | ICD-10-CM

## 2013-10-24 DIAGNOSIS — L0390 Cellulitis, unspecified: Secondary | ICD-10-CM

## 2013-10-24 DIAGNOSIS — L03119 Cellulitis of unspecified part of limb: Secondary | ICD-10-CM

## 2013-10-24 DIAGNOSIS — I890 Lymphedema, not elsewhere classified: Secondary | ICD-10-CM | POA: Insufficient documentation

## 2013-10-24 DIAGNOSIS — M25561 Pain in right knee: Secondary | ICD-10-CM

## 2013-10-24 NOTE — Progress Notes (Signed)
VASCULAR LAB PRELIMINARY  PRELIMINARY  PRELIMINARY  PRELIMINARY  Right lower extremity venous duplex completed.    Preliminary report: Right leg is negative for deep and superficial vein thrombosis.  Calf vein visualization was sub optimal due to extreme edema. Report called to FowlerWendy.   Audiel Scheiber, RVT 10/24/2013, 4:49 PM

## 2013-10-26 ENCOUNTER — Inpatient Hospital Stay (HOSPITAL_COMMUNITY)
Admission: AD | Admit: 2013-10-26 | Discharge: 2013-10-29 | DRG: 603 | Disposition: A | Discharge status: Home / Self Care | Payer: BC Managed Care – PPO | Source: Ambulatory Visit | Attending: Internal Medicine | Admitting: Internal Medicine

## 2013-10-26 ENCOUNTER — Encounter (HOSPITAL_COMMUNITY): Payer: Self-pay | Admitting: Internal Medicine

## 2013-10-26 DIAGNOSIS — L0390 Cellulitis, unspecified: Secondary | ICD-10-CM | POA: Diagnosis present

## 2013-10-26 DIAGNOSIS — L02419 Cutaneous abscess of limb, unspecified: Principal | ICD-10-CM | POA: Diagnosis present

## 2013-10-26 DIAGNOSIS — E039 Hypothyroidism, unspecified: Secondary | ICD-10-CM | POA: Diagnosis present

## 2013-10-26 DIAGNOSIS — Z91018 Allergy to other foods: Secondary | ICD-10-CM

## 2013-10-26 DIAGNOSIS — I890 Lymphedema, not elsewhere classified: Secondary | ICD-10-CM | POA: Diagnosis present

## 2013-10-26 DIAGNOSIS — E876 Hypokalemia: Secondary | ICD-10-CM | POA: Diagnosis present

## 2013-10-26 DIAGNOSIS — Z79899 Other long term (current) drug therapy: Secondary | ICD-10-CM

## 2013-10-26 DIAGNOSIS — Z23 Encounter for immunization: Secondary | ICD-10-CM

## 2013-10-26 DIAGNOSIS — E8809 Other disorders of plasma-protein metabolism, not elsewhere classified: Secondary | ICD-10-CM | POA: Diagnosis present

## 2013-10-26 DIAGNOSIS — L03119 Cellulitis of unspecified part of limb: Principal | ICD-10-CM

## 2013-10-26 DIAGNOSIS — K219 Gastro-esophageal reflux disease without esophagitis: Secondary | ICD-10-CM | POA: Diagnosis present

## 2013-10-26 DIAGNOSIS — Z801 Family history of malignant neoplasm of trachea, bronchus and lung: Secondary | ICD-10-CM

## 2013-10-26 LAB — COMPREHENSIVE METABOLIC PANEL
ALBUMIN: 3 g/dL — AB (ref 3.5–5.2)
ALK PHOS: 80 U/L (ref 39–117)
ALT: 6 U/L (ref 0–35)
AST: 12 U/L (ref 0–37)
BILIRUBIN TOTAL: 0.2 mg/dL — AB (ref 0.3–1.2)
BUN: 8 mg/dL (ref 6–23)
CHLORIDE: 100 meq/L (ref 96–112)
CO2: 29 mEq/L (ref 19–32)
Calcium: 9 mg/dL (ref 8.4–10.5)
Creatinine, Ser: 0.7 mg/dL (ref 0.50–1.10)
GFR calc Af Amer: >90 mL/min (ref >90)
GFR calc non Af Amer: >90 mL/min (ref >90)
Glucose, Bld: 67 mg/dL — ABNORMAL LOW (ref 70–99)
POTASSIUM: 3.3 meq/L — AB (ref 3.7–5.3)
Sodium: 141 mEq/L (ref 137–147)
Total Protein: 7.1 g/dL (ref 6.0–8.3)

## 2013-10-26 LAB — CBC WITH DIFFERENTIAL/PLATELET
BASOS ABS: 0 10*3/uL (ref 0.0–0.1)
BASOS PCT: 0 % (ref 0–1)
EOS ABS: 0.3 10*3/uL (ref 0.0–0.7)
EOS PCT: 2 % (ref 0–5)
HCT: 39.2 % (ref 36.0–46.0)
Hemoglobin: 13.4 g/dL (ref 12.0–15.0)
Lymphocytes Relative: 17 % (ref 12–46)
Lymphs Abs: 1.7 10*3/uL (ref 0.7–4.0)
MCH: 30.2 pg (ref 26.0–34.0)
MCHC: 34.2 g/dL (ref 30.0–36.0)
MCV: 88.5 fL (ref 78.0–100.0)
Monocytes Absolute: 1 10*3/uL (ref 0.1–1.0)
Monocytes Relative: 10 % (ref 3–12)
Neutro Abs: 7.2 10*3/uL (ref 1.7–7.7)
Neutrophils Relative %: 71 % (ref 43–77)
PLATELETS: 297 10*3/uL (ref 150–400)
RBC: 4.43 MIL/uL (ref 3.87–5.11)
RDW: 14.4 % (ref 11.5–15.5)
WBC: 10.2 10*3/uL (ref 4.0–10.5)

## 2013-10-26 LAB — SEDIMENTATION RATE: Sed Rate: 45 mm/hr — ABNORMAL HIGH (ref 0–22)

## 2013-10-26 LAB — TSH: TSH: 1.845 u[IU]/mL (ref 0.350–4.500)

## 2013-10-26 MED ORDER — SODIUM CHLORIDE 0.9 % IV SOLN: 250.0000 mL | INTRAVENOUS | Status: DC | PRN

## 2013-10-26 MED ORDER — INFLUENZA VAC SPLIT QUAD 0.5 ML IM SUSP
0.5000 mL | INTRAMUSCULAR | Status: AC
  Administered 2013-10-27: 0.5 mL via INTRAMUSCULAR
  Filled 2013-10-26 (×2): qty 0.5

## 2013-10-26 MED ORDER — LINEZOLID 2 MG/ML IV SOLN
600.0000 mg | Freq: Two times a day (BID) | INTRAVENOUS | Status: DC
Start: 2013-10-26 — End: 2013-10-28
  Administered 2013-10-26 – 2013-10-28 (×4): 600 mg via INTRAVENOUS
  Filled 2013-10-26 (×5): qty 300

## 2013-10-26 MED ORDER — VANCOMYCIN HCL 10 G IV SOLR
1750.0000 mg | Freq: Once | INTRAVENOUS | Status: AC
  Administered 2013-10-26: 1750 mg via INTRAVENOUS
  Filled 2013-10-26: qty 1750

## 2013-10-26 MED ORDER — VANCOMYCIN HCL IN DEXTROSE 1-5 GM/200ML-% IV SOLN
1000.0000 mg | Freq: Two times a day (BID) | INTRAVENOUS | Status: DC
  Filled 2013-10-26: qty 200

## 2013-10-26 MED ORDER — PIPERACILLIN-TAZOBACTAM 3.375 G IVPB
3.3750 g | Freq: Three times a day (TID) | INTRAVENOUS | Status: DC
  Administered 2013-10-26 – 2013-10-29 (×10): 3.375 g via INTRAVENOUS
  Filled 2013-10-26 (×12): qty 50

## 2013-10-26 MED ORDER — ENOXAPARIN SODIUM 40 MG/0.4ML ~~LOC~~ SOLN
40.0000 mg | SUBCUTANEOUS | Status: DC
  Administered 2013-10-26 – 2013-10-29 (×4): 40 mg via SUBCUTANEOUS
  Filled 2013-10-26 (×4): qty 0.4

## 2013-10-26 MED ORDER — SODIUM CHLORIDE 0.9 % IJ SOLN
3.0000 mL | Freq: Two times a day (BID) | INTRAMUSCULAR | Status: DC
  Administered 2013-10-26 – 2013-10-29 (×2): 3 mL via INTRAVENOUS

## 2013-10-26 MED ORDER — FUROSEMIDE 10 MG/ML IJ SOLN
40.0000 mg | Freq: Every day | INTRAMUSCULAR | Status: DC
  Administered 2013-10-26 – 2013-10-29 (×4): 40 mg via INTRAVENOUS
  Filled 2013-10-26 (×4): qty 4

## 2013-10-26 MED ORDER — SODIUM CHLORIDE 0.9 % IJ SOLN: 3.0000 mL | INTRAMUSCULAR | Status: DC | PRN

## 2013-10-26 MED ORDER — PIPERACILLIN-TAZOBACTAM 3.375 G IVPB: 3.3750 g | Freq: Four times a day (QID) | INTRAVENOUS | Status: DC

## 2013-10-26 MED ORDER — DIPHENHYDRAMINE HCL 50 MG/ML IJ SOLN
25.0000 mg | Freq: Four times a day (QID) | INTRAMUSCULAR | Status: DC | PRN
  Administered 2013-10-26: 25 mg via INTRAVENOUS
  Filled 2013-10-26: qty 1

## 2013-10-26 NOTE — Progress Notes (Addendum)
ANTIBIOTIC CONSULT NOTE - INITIAL  Pharmacy Consult for vancomycin Indication: cellulitis  Allergies  Allergen Reactions  . Spinach Nausea And Vomiting    Patient Measurements: Height: 5\' 2"  (157.5 cm) Weight: 194 lb 8 oz (88.225 kg) IBW/kg (Calculated) : 50.1   Vital Signs: Temp: 97.8 F (36.6 C) (01/09 1345) Temp src: Oral (01/09 1345) BP: 126/72 mmHg (01/09 1345) Pulse Rate: 79 (01/09 1345) Intake/Output from previous day:   Intake/Output from this shift:    Labs: No results found for this basename: WBC, HGB, PLT, LABCREA, CREATININE,  in the last 72 hours Estimated Creatinine Clearance: 68 ml/min (by C-G formula based on Cr of 1.02). No results found for this basename: VANCOTROUGH, VANCOPEAK, VANCORANDOM, GENTTROUGH, GENTPEAK, GENTRANDOM, TOBRATROUGH, TOBRAPEAK, TOBRARND, AMIKACINPEAK, AMIKACINTROU, AMIKACIN,  in the last 72 hours   Microbiology: No results found for this or any previous visit (from the past 720 hour(s)).  Medical History: Past Medical History  Diagnosis Date  . Lymphedema   . Hypothyroid     Medications:  Prescriptions prior to admission  Medication Sig Dispense Refill  . amoxicillin-clavulanate (AUGMENTIN) 875-125 MG per tablet Take 1 tablet by mouth 2 (two) times daily.      . Cholecalciferol (VITAMIN D) 2000 UNITS tablet Take 4,000 Units by mouth daily.      . clindamycin (CLEOCIN) 150 MG capsule Takes 2 (two) tablets by mouth 3 (three) times daily for 10 days.  60 capsule  0  . furosemide (LASIX) 40 MG tablet Take 40 mg by mouth daily.      Marland Kitchen. HYDROcodone-acetaminophen (NORCO/VICODIN) 5-325 MG per tablet Take 2 tablets by mouth every 4 (four) hours as needed for pain.  10 tablet  0  . ibuprofen (ADVIL,MOTRIN) 200 MG tablet Take 400-600 mg by mouth every 6 (six) hours as needed for pain.      . naproxen (NAPROSYN) 500 MG tablet Take 1 tablet (500 mg total) by mouth 2 (two) times daily with a meal.  30 tablet  0  . omeprazole (PRILOSEC) 20 MG  capsule Take 20 mg by mouth daily.      . potassium chloride SA (K-DUR,KLOR-CON) 20 MEQ tablet Take 1 tablet (20 mEq total) by mouth daily.  10 tablet  0  . sulfamethoxazole-trimethoprim (BACTRIM DS) 800-160 MG per tablet Take 1 tablet by mouth 2 (two) times daily. Additional 7 day course for lower extremity cellulitis, beginning 01/01/13      . sulfamethoxazole-trimethoprim (BACTRIM DS,SEPTRA DS) 800-160 MG per tablet Take 1 tablet by mouth 2 (two) times daily. One po bid x 10 days  20 tablet  0  . theophylline (THEODUR) 100 MG 12 hr tablet Take 100 mg by mouth daily.      Marland Kitchen. thyroid (ARMOUR) 120 MG tablet Take 120 mg by mouth once a week. Take 1 tablet on sunday      . thyroid (ARMOUR) 90 MG tablet Take 90 mg by mouth daily. Takes 90mg  tablet once daily Monday through saturday       Assessment: 53 yo lady to start vancomycin for cellulitis.  She was on augmentin and zyvox as outpt.   Goal of Therapy:  Vancomycin trough level 10-15 mcg/ml  Plan:  Vancomycin 1750 mg IV X 1. Will f/u renal function for further dosing.  [name] [name] 10/26/2013,2:45 PM  Addum:  SrCr 0.7, CrCl ~87 ml/min.  Will give vanc 1 gm IV q12 hours. F/u clinical course.

## 2013-10-26 NOTE — Progress Notes (Signed)
Late entry: Patient arrived to room. A/Ox4, oriented to room, made comfortable. MD notified and vitals taken.

## 2013-10-26 NOTE — H&P (Signed)
[name] [name] is an 53 y.o. female.   Chief Complaint: cellulitis HPI: 53 yo femalle with hx of lymphedema of the right distal lower ext and hx of cellulitis in the past noticed that her right distal lower ext had rednessx6 days, pt was treated with oral zyvox and augmentin and redness has slightly improved but right distal lower swelling is worse.  Pt did have ultrasound which was negative for DVT.  Pt denies fever, cp, palp, sob, n/v, diarrhea, brbpr, black stool.  Pt will be admitted for refractory cellulitis and  Increased in edema.     Past Medical History  Diagnosis Date  . Lymphedema   . Hypothyroid     Past Surgical History  Procedure Laterality Date  . Ovarian cyst removal      Family History  Problem Relation Age of Onset  . Lung cancer Mother 24   Social History:  reports that she has never smoked. She does not have any smokeless tobacco history on file. She reports that she drinks alcohol. She reports that she does not use illicit drugs.  Allergies:  Allergies  Allergen Reactions  . Spinach Nausea And Vomiting    Medications Prior to Admission  Medication Sig Dispense Refill  . amoxicillin-clavulanate (AUGMENTIN) 875-125 MG per tablet Take 1 tablet by mouth 2 (two) times daily.      . Cholecalciferol (VITAMIN D) 2000 UNITS tablet Take 4,000 Units by mouth daily.      . clindamycin (CLEOCIN) 150 MG capsule Takes 2 (two) tablets by mouth 3 (three) times daily for 10 days.  60 capsule  0  . furosemide (LASIX) 40 MG tablet Take 40 mg by mouth daily.      Marland Kitchen HYDROcodone-acetaminophen (NORCO/VICODIN) 5-325 MG per tablet Take 2 tablets by mouth every 4 (four) hours as needed for pain.  10 tablet  0  . ibuprofen (ADVIL,MOTRIN) 200 MG tablet Take 400-600 mg by mouth every 6 (six) hours as needed for pain.      . naproxen (NAPROSYN) 500 MG tablet Take 1 tablet (500 mg total) by mouth 2 (two) times daily with a meal.  30 tablet  0  . omeprazole (PRILOSEC) 20 MG capsule Take  20 mg by mouth daily.      . potassium chloride SA (K-DUR,KLOR-CON) 20 MEQ tablet Take 1 tablet (20 mEq total) by mouth daily.  10 tablet  0  . sulfamethoxazole-trimethoprim (BACTRIM DS) 800-160 MG per tablet Take 1 tablet by mouth 2 (two) times daily. Additional 7 day course for lower extremity cellulitis, beginning 01/01/13      . sulfamethoxazole-trimethoprim (BACTRIM DS,SEPTRA DS) 800-160 MG per tablet Take 1 tablet by mouth 2 (two) times daily. One po bid x 10 days  20 tablet  0  . theophylline (THEODUR) 100 MG 12 hr tablet Take 100 mg by mouth daily.      Marland Kitchen thyroid (ARMOUR) 120 MG tablet Take 120 mg by mouth once a week. Take 1 tablet on sunday      . thyroid (ARMOUR) 90 MG tablet Take 90 mg by mouth daily. Takes 90mg  tablet once daily Monday through saturday        No results found for this or any previous visit (from the past 48 hour(s)). No results found.  ROS  Negative for all organ systems except for + above   Blood pressure 126/72, pulse 79, temperature 97.8 F (36.6 C), temperature source Oral, resp. rate 18, height 5\' 2"  (1.575 m), weight 194 lb 8  oz (88.225 kg), SpO2 100.00%. Physical Exam   Heent: anicteric Neck: no jvd, no bruit, no tm,  Heart: rrr s1, s2 Lung: ctab Abd soft, nt, nd, +bs Ext: no c/c/,  2+ edema of the right distal lower ext Skin: + erythema of the right distal lower ext up to the knee, slightly better than prior to abx, but still persistent and there is warmth Lymph: no cervical adenopathy Neuro:  nonfocal  Assessment/Plan Cellulitis: vanco iv pharmacy to dose,  Zosyn iv pharmacy to dose Edema:  U/s ro DVT since edema worse,  And we will use lasix 40mg  iv qday, 1st dose now Hypothyroidism:  Cont armour thyroid Gerd:  prilosec  Lymphedema: cont theophylline   Drevin Ortner 10/26/2013, 1:56 PM

## 2013-10-27 LAB — CBC WITH DIFFERENTIAL/PLATELET
BASOS ABS: 0 10*3/uL (ref 0.0–0.1)
Basophils Relative: 0 % (ref 0–1)
EOS ABS: 0.3 10*3/uL (ref 0.0–0.7)
EOS PCT: 3 % (ref 0–5)
HEMATOCRIT: 36.3 % (ref 36.0–46.0)
Hemoglobin: 12.1 g/dL (ref 12.0–15.0)
LYMPHS ABS: 1.6 10*3/uL (ref 0.7–4.0)
LYMPHS PCT: 16 % (ref 12–46)
MCH: 29.6 pg (ref 26.0–34.0)
MCHC: 33.3 g/dL (ref 30.0–36.0)
MCV: 88.8 fL (ref 78.0–100.0)
MONO ABS: 0.7 10*3/uL (ref 0.1–1.0)
Monocytes Relative: 7 % (ref 3–12)
Neutro Abs: 7.7 10*3/uL (ref 1.7–7.7)
Neutrophils Relative %: 74 % (ref 43–77)
Platelets: 276 10*3/uL (ref 150–400)
RBC: 4.09 MIL/uL (ref 3.87–5.11)
RDW: 14.6 % (ref 11.5–15.5)
WBC: 10.4 10*3/uL (ref 4.0–10.5)

## 2013-10-27 LAB — COMPREHENSIVE METABOLIC PANEL
ALT: 5 U/L (ref 0–35)
AST: 11 U/L (ref 0–37)
Albumin: 2.9 g/dL — ABNORMAL LOW (ref 3.5–5.2)
Alkaline Phosphatase: 69 U/L (ref 39–117)
BUN: 8 mg/dL (ref 6–23)
CALCIUM: 8.6 mg/dL (ref 8.4–10.5)
CO2: 26 mEq/L (ref 19–32)
CREATININE: 0.69 mg/dL (ref 0.50–1.10)
Chloride: 102 mEq/L (ref 96–112)
GFR calc non Af Amer: >90 mL/min (ref >90)
GLUCOSE: 118 mg/dL — AB (ref 70–99)
Potassium: 3.4 mEq/L — ABNORMAL LOW (ref 3.7–5.3)
Sodium: 141 mEq/L (ref 137–147)
Total Bilirubin: 0.4 mg/dL (ref 0.3–1.2)
Total Protein: 6.4 g/dL (ref 6.0–8.3)

## 2013-10-27 MED ORDER — POTASSIUM CHLORIDE CRYS ER 20 MEQ PO TBCR
20.0000 meq | EXTENDED_RELEASE_TABLET | Freq: Two times a day (BID) | ORAL | Status: AC
  Administered 2013-10-27 – 2013-10-28 (×3): 20 meq via ORAL
  Filled 2013-10-27 (×3): qty 1

## 2013-10-27 MED ORDER — FUROSEMIDE 10 MG/ML IJ SOLN
20.0000 mg | Freq: Once | INTRAMUSCULAR | Status: AC
  Administered 2013-10-27: 20 mg via INTRAVENOUS

## 2013-10-27 MED ORDER — THYROID 120 MG PO TABS
120.0000 mg | ORAL_TABLET | ORAL | Status: DC
  Administered 2013-10-28: 120 mg via ORAL
  Filled 2013-10-27: qty 1

## 2013-10-27 MED ORDER — FUROSEMIDE 10 MG/ML IJ SOLN
INTRAMUSCULAR | Status: AC
  Filled 2013-10-27: qty 4

## 2013-10-27 MED ORDER — THEOPHYLLINE ER 100 MG PO TB12
100.0000 mg | ORAL_TABLET | Freq: Every day | ORAL | Status: DC
  Administered 2013-10-27 – 2013-10-29 (×3): 100 mg via ORAL
  Filled 2013-10-27 (×4): qty 1

## 2013-10-27 MED ORDER — THYROID 60 MG PO TABS
90.0000 mg | ORAL_TABLET | ORAL | Status: DC
  Administered 2013-10-27 – 2013-10-29 (×2): 90 mg via ORAL
  Filled 2013-10-27 (×3): qty 1

## 2013-10-27 NOTE — Progress Notes (Signed)
Subjective:  Edema:  Improved slightly,   Cellulitis:  There is still redness over the right distal lower ext,  Vanco=>itching,     Objective: Vital signs in last 24 hours: Temp:  [97.7 F (36.5 C)-98.7 F (37.1 C)] 98 F (36.7 C) (01/10 1314) Pulse Rate:  [65-90] 65 (01/10 1314) Resp:  [16-18] 18 (01/10 1314) BP: (106-131)/(63-84) 122/84 mmHg (01/10 1314) SpO2:  [96 %-98 %] 97 % (01/10 1314) Weight:  [192 lb 12.8 oz (87.454 kg)] 192 lb 12.8 oz (87.454 kg) (01/09 2110) Weight change:  Last BM Date: 10/25/13  Intake/Output from previous day: 01/09 0701 - 01/10 0700 In: 360 [P.O.:360] Out: -  Intake/Output this shift: Total I/O In: 600 [P.O.:600] Out: -   Heent: anicteric Neck: no jvd , no bruit,  Heart: rrr s, 1s2,  Lung: ctab Abd: soft, nt, +bs Ext: no c/c/ 1-2+ edema right distal lower ext Skin: redness is 3/4 up to right knee   Lab Results:  Recent Labs  10/26/13 1520 10/27/13 0345  WBC 10.2 10.4  HGB 13.4 12.1  HCT 39.2 36.3  PLT 297 276   BMET  Recent Labs  10/26/13 1520 10/27/13 0345  NA 141 141  K 3.3* 3.4*  CL 100 102  CO2 29 26  GLUCOSE 67* 118*  BUN 8 8  CREATININE 0.70 0.69  CALCIUM 9.0 8.6    Studies/Results: No results found.  Medications: I have reviewed the patient's current medications.  Assessment/Plan: Cellulitis: vanco=> itching,  Transitioned to zyvox and continue with zosyn,  Improving Edema: improving with iv lasix Hypokalemia: replete    LOS: 1 day   Pearson GrippeKIM, Ariely Riddell 10/27/2013, 4:40 PM

## 2013-10-28 LAB — COMPREHENSIVE METABOLIC PANEL
ALK PHOS: 70 U/L (ref 39–117)
ALT: 5 U/L (ref 0–35)
AST: 10 U/L (ref 0–37)
Albumin: 3 g/dL — ABNORMAL LOW (ref 3.5–5.2)
BILIRUBIN TOTAL: 0.3 mg/dL (ref 0.3–1.2)
BUN: 12 mg/dL (ref 6–23)
CHLORIDE: 100 meq/L (ref 96–112)
CO2: 28 meq/L (ref 19–32)
Calcium: 9.3 mg/dL (ref 8.4–10.5)
Creatinine, Ser: 0.73 mg/dL (ref 0.50–1.10)
GFR calc Af Amer: >90 mL/min (ref >90)
GLUCOSE: 95 mg/dL (ref 70–99)
POTASSIUM: 4 meq/L (ref 3.7–5.3)
SODIUM: 140 meq/L (ref 137–147)
Total Protein: 7.1 g/dL (ref 6.0–8.3)

## 2013-10-28 LAB — CBC
HCT: 39.1 % (ref 36.0–46.0)
HEMOGLOBIN: 13.1 g/dL (ref 12.0–15.0)
MCH: 30 pg (ref 26.0–34.0)
MCHC: 33.5 g/dL (ref 30.0–36.0)
MCV: 89.7 fL (ref 78.0–100.0)
Platelets: 309 10*3/uL (ref 150–400)
RBC: 4.36 MIL/uL (ref 3.87–5.11)
RDW: 14.5 % (ref 11.5–15.5)
WBC: 7.7 10*3/uL (ref 4.0–10.5)

## 2013-10-28 MED ORDER — LINEZOLID 600 MG PO TABS
600.0000 mg | ORAL_TABLET | Freq: Two times a day (BID) | ORAL | Status: DC
  Administered 2013-10-28 – 2013-10-29 (×2): 600 mg via ORAL
  Filled 2013-10-28 (×4): qty 1

## 2013-10-28 NOTE — Progress Notes (Signed)
Subjective:  Cellulitis: much better this morning, right distal lower ext is improved.   Edema: continues to improve Hypokalemia:  repleted and good this am Hypoalbuminemia:  Will need monitoring  Objective: Vital signs in last 24 hours: Temp:  [97.7 F (36.5 C)-98.4 F (36.9 C)] 98.4 F (36.9 C) (01/11 0440) Pulse Rate:  [65-78] 70 (01/11 0440) Resp:  [16-18] 18 (01/11 0440) BP: (102-122)/(62-84) 109/64 mmHg (01/11 0440) SpO2:  [96 %-98 %] 96 % (01/11 0440) Weight:  [191 lb 8 oz (86.864 kg)] 191 lb 8 oz (86.864 kg) (01/10 2159)  Intake/Output from previous day: 01/10 0701 - 01/11 0700 In: 1360 [P.O.:960] Out: -  Intake/Output this shift:    Heent: anicteirc Neck: no jvd, no bruit,  Heart: rrr s1, s2 Lung: ctab Abd: soft, nt, nd, +bs Ext: no c/c/ 1+ edema right distal lower ext Skin: redness is much less lighter and extends still about 1/2 up to knee   Results for orders placed during the hospital encounter of 10/26/13 (from the past 24 hour(s))  CBC     Status: None   Collection Time    10/28/13  5:00 AM      Result Value Range   WBC 7.7  4.0 - 10.5 K/uL   RBC 4.36  3.87 - 5.11 MIL/uL   Hemoglobin 13.1  12.0 - 15.0 g/dL   HCT 96.0  45.4 - 09.8 %   MCV 89.7  78.0 - 100.0 fL   MCH 30.0  26.0 - 34.0 pg   MCHC 33.5  30.0 - 36.0 g/dL   RDW 11.9  14.7 - 82.9 %   Platelets 309  150 - 400 K/uL  COMPREHENSIVE METABOLIC PANEL     Status: Abnormal   Collection Time    10/28/13  5:00 AM      Result Value Range   Sodium 140  137 - 147 mEq/L   Potassium 4.0  3.7 - 5.3 mEq/L   Chloride 100  96 - 112 mEq/L   CO2 28  19 - 32 mEq/L   Glucose, Bld 95  70 - 99 mg/dL   BUN 12  6 - 23 mg/dL   Creatinine, Ser 5.62  0.50 - 1.10 mg/dL   Calcium 9.3  8.4 - 13.0 mg/dL   Total Protein 7.1  6.0 - 8.3 g/dL   Albumin 3.0 (*) 3.5 - 5.2 g/dL   AST 10  0 - 37 U/L   ALT 5  0 - 35 U/L   Alkaline Phosphatase 70  39 - 117 U/L   Total Bilirubin 0.3  0.3 - 1.2 mg/dL   GFR calc non Af Amer  >90  >90 mL/min   GFR calc Af Amer >90  >90 mL/min    Studies/Results: No results found.  Scheduled Meds: . enoxaparin (LOVENOX) injection  40 mg Subcutaneous Q24H  . furosemide  40 mg Intravenous Daily  . linezolid  600 mg Intravenous Q12H  . piperacillin-tazobactam (ZOSYN)  IV  3.375 g Intravenous Q8H  . potassium chloride  20 mEq Oral BID  . sodium chloride  3 mL Intravenous Q12H  . theophylline  100 mg Oral Daily  . thyroid  120 mg Oral Q Sun  . thyroid  90 mg Oral Custom   Continuous Infusions:  PRN Meds:sodium chloride, diphenhydrAMINE, sodium chloride  Assessment/Plan: Edema:  Cont iv lasix Celllulitis: cont zyvox D#2, and zosyn D#3 Hypokalemia: cont to replete Hypothyroidism: cont armour thryoid Hypoalbuminemia:  Eat more protein    LOS:  2 days   Pearson GrippeKIM, Jamas Jaquay

## 2013-10-28 NOTE — Progress Notes (Signed)
This patient is receiving Linezolid. Based on criteria approved by the Pharmacy and Therapeutics Committee, this medication is being converted to the equivalent oral dose form. These criteria include:   . The patient is eating (either orally or per tube) and/or has been taking other orally administered medications for at least 24 hours.  . This patient has no evidence of active gastrointestinal bleeding or impaired GI absorption (gastrectomy, short bowel, patient on TNA or NPO).   Patient's cellulitis also appears to be improving. If you have questions about this conversion, please contact the pharmacy department.  Tyrone NineAlvin B. Artelia Larocheung, PharmD Clinical Pharmacist - Resident Pager: [phone] 10/28/2013 12:39 PM

## 2013-10-29 LAB — COMPREHENSIVE METABOLIC PANEL
ALT: 5 U/L (ref 0–35)
AST: 9 U/L (ref 0–37)
Albumin: 2.8 g/dL — ABNORMAL LOW (ref 3.5–5.2)
Alkaline Phosphatase: 64 U/L (ref 39–117)
BUN: 14 mg/dL (ref 6–23)
CALCIUM: 8.7 mg/dL (ref 8.4–10.5)
CO2: 25 meq/L (ref 19–32)
Chloride: 103 mEq/L (ref 96–112)
Creatinine, Ser: 0.77 mg/dL (ref 0.50–1.10)
GFR calc Af Amer: >90 mL/min (ref >90)
Glucose, Bld: 90 mg/dL (ref 70–99)
Potassium: 4.4 mEq/L (ref 3.7–5.3)
Sodium: 140 mEq/L (ref 137–147)
Total Bilirubin: 0.2 mg/dL — ABNORMAL LOW (ref 0.3–1.2)
Total Protein: 6.6 g/dL (ref 6.0–8.3)

## 2013-10-29 MED ORDER — LINEZOLID 600 MG PO TABS: 600.0000 mg | ORAL_TABLET | Freq: Two times a day (BID) | ORAL | Status: AC

## 2013-10-29 MED ORDER — AMOXICILLIN-POT CLAVULANATE 875-125 MG PO TABS: 1.0000 | ORAL_TABLET | Freq: Two times a day (BID) | ORAL | Status: AC

## 2013-10-29 NOTE — Discharge Instructions (Signed)
Please contact Dr. Selena BattenKim your PCP if not improving

## 2013-10-29 NOTE — Discharge Summary (Addendum)
Physician Discharge Summary   Patient ID: [name] [name] [id number] 53 y.o. Mar 14, 1961  Admit date: 10/26/2013  Discharge date and time: No discharge date for patient encounter.   10/29/2013  Admitting Physician: [name], MD   Discharge Physician:  [name]   Admission Diagnoses: Cellulitis  Discharge Diagnoses: Cellulitis, Edema, Lymphedema, Hypothyroidism, Hypokalemia  Admission Condition: serious  Discharged Condition: good  Indication for Admission: cellulitis  Hospital Course:  53 yo female with lower ext edema, of the right leg and cellulitis refractory to oral abx, and lasix.  Pt admitted and tx with iv vanco => itching and then changed to zyvox, and continued on zosyn,  Blood cultures negative.  U/s not repeated due to improved in edema after tx with iv lasix.  Doing better.  Pt stable and wants to go home on oral abx,    Consults: None  Significant Diagnostic Studies: labs:   Treatments: antibiotics: Zosyn  Discharge Exam: Heent: aniacteric Neck: no jvd no bruit, no tm Heart: rrr s1, s2 Lung: ctab Abd: soft, nt, nd, +bs Ext: no c/c/e Skin: redness improving,  Only 1/3 up to knee, on right distal lower ext, trace edema   Disposition: 01-Home or Self Care  Patient Instructions:    Medication List    STOP taking these medications       ibuprofen 200 MG tablet  Commonly known as:  ADVIL,MOTRIN      TAKE these medications       amoxicillin-clavulanate 875-125 MG per tablet  Commonly known as:  AUGMENTIN  Take 1 tablet by mouth 2 (two) times daily.     furosemide 40 MG tablet  Commonly known as:  LASIX  Take 40 mg by mouth daily.     linezolid 600 MG tablet  Commonly known as:  ZYVOX  Take 1 tablet (600 mg total) by mouth 2 (two) times daily.     theophylline 100 MG 12 hr tablet  Commonly known as:  THEODUR  Take 100 mg by mouth daily.     thyroid 90 MG tablet  Commonly known as:  ARMOUR  Take 90 mg by mouth See admin instructions.  Takes daily except for Sundays.     thyroid 120 MG tablet  Commonly known as:  ARMOUR  Take 120 mg by mouth every Sunday.       Activity: activity as tolerated Diet: regular diet Wound Care: none needed   Cellulitis;  Complete zyvox, and augmentin Edema: cont lasix Hypothryoidism: cont armour thyroid Hypokalemia: resolved  Follow-up with Dr. Pearson GrippeJames Kenda Kloehn  in 2 week.  SignedPearson Grippe: [name] 10/29/2013 3:58 PM Time spent 30 minutes

## 2013-10-29 NOTE — Progress Notes (Signed)
Patient discharged.  Patient educated on discharge instructions, follow-up appointment, and discharge medications.  No new prescription needed.  Patient educated on cellulitis, signs and symptoms, and when to call a doctor.  Patient verbalized understanding, AVS signed.  IV removed.  Belongings gathered.  Patient escorted to car.

## 2014-04-09 ENCOUNTER — Other Ambulatory Visit: Payer: Self-pay

## 2014-04-09 DIAGNOSIS — Z1231 Encounter for screening mammogram for malignant neoplasm of breast: Secondary | ICD-10-CM

## 2014-04-15 ENCOUNTER — Other Ambulatory Visit (HOSPITAL_COMMUNITY)
Admission: RE | Admit: 2014-04-15 | Discharge: 2014-04-15 | Disposition: A | Discharge status: Home / Self Care | Payer: 59 | Source: Ambulatory Visit | Attending: Internal Medicine | Admitting: Internal Medicine

## 2014-04-15 ENCOUNTER — Other Ambulatory Visit: Payer: Self-pay | Admitting: Registered Nurse

## 2014-04-15 DIAGNOSIS — Z01419 Encounter for gynecological examination (general) (routine) without abnormal findings: Secondary | ICD-10-CM | POA: Insufficient documentation

## 2014-04-16 ENCOUNTER — Ambulatory Visit
Admission: RE | Admit: 2014-04-16 | Discharge: 2014-04-16 | Disposition: A | Discharge status: Home / Self Care | Payer: No Typology Code available for payment source | Source: Ambulatory Visit

## 2014-04-16 DIAGNOSIS — Z1231 Encounter for screening mammogram for malignant neoplasm of breast: Secondary | ICD-10-CM

## 2014-04-17 LAB — CYTOLOGY - PAP

## 2014-07-17 ENCOUNTER — Inpatient Hospital Stay (HOSPITAL_COMMUNITY)
Admission: EM | Admit: 2014-07-17 | Discharge: 2014-07-20 | DRG: 603 | Disposition: A | Discharge status: Home / Self Care | Payer: No Typology Code available for payment source | Attending: Internal Medicine | Admitting: Internal Medicine

## 2014-07-17 ENCOUNTER — Encounter (HOSPITAL_COMMUNITY): Payer: Self-pay | Admitting: Emergency Medicine

## 2014-07-17 DIAGNOSIS — Z881 Allergy status to other antibiotic agents status: Secondary | ICD-10-CM

## 2014-07-17 DIAGNOSIS — Z79899 Other long term (current) drug therapy: Secondary | ICD-10-CM

## 2014-07-17 DIAGNOSIS — L03116 Cellulitis of left lower limb: Secondary | ICD-10-CM | POA: Diagnosis not present

## 2014-07-17 DIAGNOSIS — L0390 Cellulitis, unspecified: Secondary | ICD-10-CM

## 2014-07-17 DIAGNOSIS — L03119 Cellulitis of unspecified part of limb: Secondary | ICD-10-CM | POA: Diagnosis present

## 2014-07-17 DIAGNOSIS — I890 Lymphedema, not elsewhere classified: Secondary | ICD-10-CM | POA: Diagnosis present

## 2014-07-17 DIAGNOSIS — M79605 Pain in left leg: Secondary | ICD-10-CM | POA: Diagnosis not present

## 2014-07-17 DIAGNOSIS — E039 Hypothyroidism, unspecified: Secondary | ICD-10-CM | POA: Diagnosis present

## 2014-07-17 DIAGNOSIS — E038 Other specified hypothyroidism: Secondary | ICD-10-CM

## 2014-07-17 HISTORY — DX: Cellulitis, unspecified: L03.90

## 2014-07-17 LAB — COMPREHENSIVE METABOLIC PANEL
ALBUMIN: 3.4 g/dL — AB (ref 3.5–5.2)
ALK PHOS: 71 U/L (ref 39–117)
ALT: 11 U/L (ref 0–35)
AST: 18 U/L (ref 0–37)
Anion gap: 12 (ref 5–15)
BUN: 14 mg/dL (ref 6–23)
CO2: 28 mEq/L (ref 19–32)
Calcium: 9 mg/dL (ref 8.4–10.5)
Chloride: 99 mEq/L (ref 96–112)
Creatinine, Ser: 0.71 mg/dL (ref 0.50–1.10)
GFR calc Af Amer: >90 mL/min (ref >90)
GFR calc non Af Amer: >90 mL/min (ref >90)
Glucose, Bld: 94 mg/dL (ref 70–99)
POTASSIUM: 4.1 meq/L (ref 3.7–5.3)
Sodium: 139 mEq/L (ref 137–147)
Total Bilirubin: 0.3 mg/dL (ref 0.3–1.2)
Total Protein: 7.6 g/dL (ref 6.0–8.3)

## 2014-07-17 LAB — CBC WITH DIFFERENTIAL/PLATELET
BASOS ABS: 0.1 10*3/uL (ref 0.0–0.1)
Basophils Relative: 1 % (ref 0–1)
Eosinophils Absolute: 0.5 10*3/uL (ref 0.0–0.7)
Eosinophils Relative: 5 % (ref 0–5)
HEMATOCRIT: 41.2 % (ref 36.0–46.0)
Hemoglobin: 13.3 g/dL (ref 12.0–15.0)
LYMPHS PCT: 22 % (ref 12–46)
Lymphs Abs: 2.1 10*3/uL (ref 0.7–4.0)
MCH: 29.8 pg (ref 26.0–34.0)
MCHC: 32.3 g/dL (ref 30.0–36.0)
MCV: 92.2 fL (ref 78.0–100.0)
Monocytes Absolute: 0.8 10*3/uL (ref 0.1–1.0)
Monocytes Relative: 8 % (ref 3–12)
NEUTROS ABS: 6.2 10*3/uL (ref 1.7–7.7)
Neutrophils Relative %: 64 % (ref 43–77)
PLATELETS: 318 10*3/uL (ref 150–400)
RBC: 4.47 MIL/uL (ref 3.87–5.11)
RDW: 14.8 % (ref 11.5–15.5)
WBC: 9.7 10*3/uL (ref 4.0–10.5)

## 2014-07-17 LAB — I-STAT CG4 LACTIC ACID, ED: Lactic Acid, Venous: 0.55 mmol/L (ref 0.5–2.2)

## 2014-07-17 MED ORDER — OXYCODONE-ACETAMINOPHEN 5-325 MG PO TABS
1.0000 | ORAL_TABLET | ORAL | Status: AC
  Administered 2014-07-17: 1 via ORAL
  Filled 2014-07-17: qty 1

## 2014-07-17 MED ORDER — LINEZOLID 2 MG/ML IV SOLN
600.0000 mg | Freq: Once | INTRAVENOUS | Status: AC
  Administered 2014-07-18: 600 mg via INTRAVENOUS
  Filled 2014-07-17: qty 300

## 2014-07-17 NOTE — ED Notes (Signed)
Pt has been treating cellulitis to left leg since last Wednesday. Sent here by PCP for further treatment and antbiotics, and has been taking keflex and augmentin.

## 2014-07-18 ENCOUNTER — Encounter (HOSPITAL_COMMUNITY): Payer: Self-pay | Admitting: Internal Medicine

## 2014-07-18 DIAGNOSIS — E039 Hypothyroidism, unspecified: Secondary | ICD-10-CM | POA: Diagnosis present

## 2014-07-18 DIAGNOSIS — I890 Lymphedema, not elsewhere classified: Secondary | ICD-10-CM

## 2014-07-18 DIAGNOSIS — M79605 Pain in left leg: Secondary | ICD-10-CM | POA: Diagnosis present

## 2014-07-18 DIAGNOSIS — L03116 Cellulitis of left lower limb: Secondary | ICD-10-CM | POA: Diagnosis present

## 2014-07-18 DIAGNOSIS — L03119 Cellulitis of unspecified part of limb: Secondary | ICD-10-CM

## 2014-07-18 DIAGNOSIS — Z79899 Other long term (current) drug therapy: Secondary | ICD-10-CM | POA: Diagnosis not present

## 2014-07-18 DIAGNOSIS — Z881 Allergy status to other antibiotic agents status: Secondary | ICD-10-CM | POA: Diagnosis not present

## 2014-07-18 LAB — COMPREHENSIVE METABOLIC PANEL
ALT: 8 U/L (ref 0–35)
AST: 13 U/L (ref 0–37)
Albumin: 2.8 g/dL — ABNORMAL LOW (ref 3.5–5.2)
Alkaline Phosphatase: 60 U/L (ref 39–117)
Anion gap: 10 (ref 5–15)
BUN: 10 mg/dL (ref 6–23)
CHLORIDE: 105 meq/L (ref 96–112)
CO2: 27 meq/L (ref 19–32)
Calcium: 8.6 mg/dL (ref 8.4–10.5)
Creatinine, Ser: 0.69 mg/dL (ref 0.50–1.10)
GFR calc Af Amer: >90 mL/min (ref >90)
GLUCOSE: 91 mg/dL (ref 70–99)
Potassium: 4.1 mEq/L (ref 3.7–5.3)
SODIUM: 142 meq/L (ref 137–147)
Total Bilirubin: 0.3 mg/dL (ref 0.3–1.2)
Total Protein: 6.2 g/dL (ref 6.0–8.3)

## 2014-07-18 LAB — CBC WITH DIFFERENTIAL/PLATELET
Basophils Absolute: 0.1 10*3/uL (ref 0.0–0.1)
Basophils Relative: 1 % (ref 0–1)
EOS PCT: 5 % (ref 0–5)
Eosinophils Absolute: 0.4 10*3/uL (ref 0.0–0.7)
HCT: 37.2 % (ref 36.0–46.0)
Hemoglobin: 12.1 g/dL (ref 12.0–15.0)
LYMPHS ABS: 1.5 10*3/uL (ref 0.7–4.0)
Lymphocytes Relative: 18 % (ref 12–46)
MCH: 30 pg (ref 26.0–34.0)
MCHC: 32.5 g/dL (ref 30.0–36.0)
MCV: 92.1 fL (ref 78.0–100.0)
MONO ABS: 0.9 10*3/uL (ref 0.1–1.0)
Monocytes Relative: 11 % (ref 3–12)
Neutro Abs: 5.7 10*3/uL (ref 1.7–7.7)
Neutrophils Relative %: 65 % (ref 43–77)
PLATELETS: 255 10*3/uL (ref 150–400)
RBC: 4.04 MIL/uL (ref 3.87–5.11)
RDW: 14.9 % (ref 11.5–15.5)
WBC: 8.6 10*3/uL (ref 4.0–10.5)

## 2014-07-18 MED ORDER — VANCOMYCIN HCL 10 G IV SOLR
1250.0000 mg | Freq: Two times a day (BID) | INTRAVENOUS | Status: DC
Start: 2014-07-18 — End: 2014-07-20
  Administered 2014-07-18 – 2014-07-20 (×4): 1250 mg via INTRAVENOUS
  Filled 2014-07-18 (×7): qty 1250

## 2014-07-18 MED ORDER — FUROSEMIDE 40 MG PO TABS
40.0000 mg | ORAL_TABLET | Freq: Every day | ORAL | Status: DC
  Administered 2014-07-18 – 2014-07-20 (×3): 40 mg via ORAL
  Filled 2014-07-18 (×3): qty 1

## 2014-07-18 MED ORDER — THYROID 30 MG PO TABS
90.0000 mg | ORAL_TABLET | ORAL | Status: DC
Start: 2014-07-18 — End: 2014-07-18

## 2014-07-18 MED ORDER — THYROID 120 MG PO TABS
120.0000 mg | ORAL_TABLET | ORAL | Status: DC
Start: 2014-07-21 — End: 2014-07-20

## 2014-07-18 MED ORDER — OXYCODONE HCL 5 MG PO TABS
5.0000 mg | ORAL_TABLET | ORAL | Status: DC | PRN
  Administered 2014-07-18 – 2014-07-19 (×5): 5 mg via ORAL
  Filled 2014-07-18 (×5): qty 1

## 2014-07-18 MED ORDER — ACETAMINOPHEN 650 MG RE SUPP: 650.0000 mg | Freq: Four times a day (QID) | RECTAL | Status: DC | PRN

## 2014-07-18 MED ORDER — INFLUENZA VAC SPLIT QUAD 0.5 ML IM SUSY
0.5000 mL | PREFILLED_SYRINGE | INTRAMUSCULAR | Status: AC
  Administered 2014-07-19: 0.5 mL via INTRAMUSCULAR
  Filled 2014-07-18: qty 0.5

## 2014-07-18 MED ORDER — ENOXAPARIN SODIUM 40 MG/0.4ML ~~LOC~~ SOLN
40.0000 mg | SUBCUTANEOUS | Status: DC
  Administered 2014-07-18 – 2014-07-20 (×3): 40 mg via SUBCUTANEOUS
  Filled 2014-07-18 (×3): qty 0.4

## 2014-07-18 MED ORDER — ONDANSETRON HCL 4 MG PO TABS: 4.0000 mg | ORAL_TABLET | Freq: Four times a day (QID) | ORAL | Status: DC | PRN

## 2014-07-18 MED ORDER — LINEZOLID 2 MG/ML IV SOLN: 600.0000 mg | Freq: Two times a day (BID) | INTRAVENOUS | Status: DC

## 2014-07-18 MED ORDER — ONDANSETRON HCL 4 MG/2ML IJ SOLN: 4.0000 mg | Freq: Four times a day (QID) | INTRAMUSCULAR | Status: DC | PRN

## 2014-07-18 MED ORDER — THYROID 60 MG PO TABS
90.0000 mg | ORAL_TABLET | ORAL | Status: DC
  Administered 2014-07-18 – 2014-07-19 (×2): 90 mg via ORAL
  Filled 2014-07-18 (×3): qty 1

## 2014-07-18 MED ORDER — SODIUM CHLORIDE 0.9 % IJ SOLN
3.0000 mL | Freq: Two times a day (BID) | INTRAMUSCULAR | Status: DC
  Administered 2014-07-18 – 2014-07-19 (×2): 3 mL via INTRAVENOUS

## 2014-07-18 MED ORDER — THEOPHYLLINE ER 100 MG PO TB12
100.0000 mg | ORAL_TABLET | Freq: Every day | ORAL | Status: DC
  Administered 2014-07-18 – 2014-07-20 (×3): 100 mg via ORAL
  Filled 2014-07-18 (×3): qty 1

## 2014-07-18 MED ORDER — ACETAMINOPHEN 325 MG PO TABS: 650.0000 mg | ORAL_TABLET | Freq: Four times a day (QID) | ORAL | Status: DC | PRN

## 2014-07-18 NOTE — H&P (Signed)
[facility] History and Physical  [name] [name] ZOX:[id number] DOB: 07-Jul-1961 DOA: 07/17/2014  Referring physician: ER physician. PCP: Pearson Grippe, MD   Chief Complaint: Left lower extremity erythema and swelling.  HPI: [name] [name] is a 53 y.o. female with history of chronic bilateral lower extremity lymphedema and hypothyroidism presents to the ER with worsening erythema and pain in the left lower extremity. Patient's symptoms started last week and was prescribed initially Keflex followed by Augmentin by patient's primary care despite which patient's symptoms did not improve and patient came to the ER. Patient has subjective feeling of fever chills and on exam patient erythema and swelling extends from left foot to the inferior aspect of her knee. Patient has mild tightness but there is no definite signs of compartment syndrome. Patient will be admitted for IV antibiotics. Patient will to vancomycin so Zyvox was started. Patient had a similar symptom last January and was treated with Zyvox.   Review of Systems: As presented in the history of presenting illness, rest negative.  Past Medical History  Diagnosis Date  . Lymphedema   . Hypothyroid    Past Surgical History  Procedure Laterality Date  . Ovarian cyst removal    . Tubal ligation     Social History:  reports that she has never smoked. She does not have any smokeless tobacco history on file. She reports that she drinks alcohol. She reports that she does not use illicit drugs. Where does patient live home. Can patient participate in ADLs? Yes.  Allergies  Allergen Reactions  . Spinach Nausea And Vomiting  . Vancomycin Itching    Family History:  Family History  Problem Relation Age of Onset  . Lung cancer Mother 41      Prior to Admission medications   Medication Sig Start Date End Date Taking? Authorizing Provider  furosemide (LASIX) 40 MG tablet Take 40 mg by mouth daily.   Yes Historical Provider, MD   theophylline (THEODUR) 100 MG 12 hr tablet Take 100 mg by mouth daily.   Yes Historical Provider, MD  thyroid (ARMOUR) 120 MG tablet Take 120 mg by mouth every Sunday.    Yes Historical Provider, MD  thyroid (ARMOUR) 90 MG tablet Take 90 mg by mouth See admin instructions. Takes daily except for Sundays.   Yes Historical Provider, MD    Physical Exam: Filed Vitals:   07/17/14 1647 07/17/14 2130 07/17/14 2334 07/18/14 0000  BP: 137/86 158/70 98/81 128/68  Pulse: 75 63 69 71  Temp: 98 F (36.7 C) 98 F (36.7 C) 97.6 F (36.4 C)   TempSrc: Oral Oral Oral   Resp: 18 20 18 22   Height: 5\' 3"  (1.6 m)     Weight: 90.719 kg (200 lb)     SpO2: 97% 100% 100% 100%     General:  Well-developed and nourished.  Eyes: Anicteric no pallor.  ENT: No discharge from the ears eyes nose mouth.  Neck: No mass felt.  Cardiovascular: S1-S2 heard.  Respiratory: No rhonchi or crepitations.  Abdomen: Soft nontender bowel sounds present. No guarding or rigidity.  Skin: Erythema of the left lower extremity extending from the foot to the inferior aspect of the left knee. With mild tenderness. Patient has good sensation.  Musculoskeletal: Bilateral lower extremity edema.  Psychiatric: Appears normal.  Neurologic: Alert awake oriented to time place and person. Moves all extremities.  Labs on Admission:  Basic Metabolic Panel:  Recent Labs Lab 07/17/14 2136  NA 139  K 4.1  CL 99  CO2 28  GLUCOSE 94  BUN 14  CREATININE 0.71  CALCIUM 9.0   Liver Function Tests:  Recent Labs Lab 07/17/14 2136  AST 18  ALT 11  ALKPHOS 71  BILITOT 0.3  PROT 7.6  ALBUMIN 3.4*   No results found for this basename: LIPASE, AMYLASE,  in the last 168 hours No results found for this basename: AMMONIA,  in the last 168 hours CBC:  Recent Labs Lab 07/17/14 2136  WBC 9.7  NEUTROABS 6.2  HGB 13.3  HCT 41.2  MCV 92.2  PLT 318   Cardiac Enzymes: No results found for this basename: CKTOTAL, CKMB,  CKMBINDEX, TROPONINI,  in the last 168 hours  BNP (last 3 results) No results found for this basename: PROBNP,  in the last 8760 hours CBG: No results found for this basename: GLUCAP,  in the last 168 hours  Radiological Exams on Admission: No results found.   Assessment/Plan Principal Problem:   Cellulitis of lower extremity Active Problems:   Lymphedema   Hypothyroidism   Cellulitis of left lower extremity   1. Cellulitis of the left lower extremity - patient has been placed on Zyvox as patient is allergic to vancomycin. Keep left leg elevated while patient is on bed. Patient at this time does not have any definite signs of compartment syndrome. Closely observe. Check Dopplers to rule out DVT. 2. Chronic lymphedema bilateral lower extremity - on Lasix. 3. History of hypothyroidism - continue thyroid replacement.    Code Status: Full code.  Family Communication: None.  Disposition Plan: Admit to inpatient.    Hugh Garrow N. [facility] Pager [phone].  If 7PM-7AM, please contact night-coverage [email or web address] Password [email or web address] 07/18/2014, 12:35 AM

## 2014-07-18 NOTE — Progress Notes (Addendum)
[facility] PROGRESS NOTE  [name] [name]:[id number] DOB: [birth year] DOA: 07/17/2014 PCP: Pearson Grippe, MD  Assessment/Plan: 53 y.o. female with history of chronic bilateral lower extremity lymphedema and hypothyroidism presents to the ER with worsening erythema and pain in the left lower extremity.  -Patient's symptoms started last week and was prescribed initially Keflex followed by Augmentin by patient's primary care despite which patient's symptoms did not improve and patient came to the ER.   1. Cellulitis of the left lower extremity on top of chronic lymphedema -on Zyvox as patient is allergic to vancomycin. Keep left leg elevated while patient is on bed. Pend Dopplers to rule out DVT.  -called d/w ID Dr. Ninetta Lights and pharmacy re: zyvox; Pt had some itching to vanc not allergy; will try IV vanc  2. Chronic lymphedema bilateral lower extremity - on Lasix.  3. History of hypothyroidism - continue thyroid replacement.  Code Status: full Family Communication: d/w patient  (indicate person spoken with, relationship, and if by phone, the number) Disposition Plan: home pend clinical improvement    Consultants:  none  Procedures:  none  Antibiotics:  zyvox 10/1<<<< (indicate start date, and stop date if known)  HPI/Subjective: alert  Objective: Filed Vitals:   07/18/14 0539  BP: 95/52  Pulse: 77  Temp: 97.6 F (36.4 C)  Resp: 18    Intake/Output Summary (Last 24 hours) at 07/18/14 0930 Last data filed at 07/18/14 0800  Gross per 24 hour  Intake    600 ml  Output      0 ml  Net    600 ml   Filed Weights   07/17/14 1647  Weight: 90.719 kg (200 lb)    Exam:   General:  alert  Cardiovascular: s1,s2 rrr  Respiratory: CTA BL  Abdomen: soft, nt,nd   Musculoskeletal: L leg erythema, edema, warm improving    Data Reviewed: Basic Metabolic Panel:  Recent Labs Lab 07/17/14 2136 07/18/14 0556  NA 139 142  K 4.1 4.1  CL 99 105  CO2 28 27   GLUCOSE 94 91  BUN 14 10  CREATININE 0.71 0.69  CALCIUM 9.0 8.6   Liver Function Tests:  Recent Labs Lab 07/17/14 2136 07/18/14 0556  AST 18 13  ALT 11 8  ALKPHOS 71 60  BILITOT 0.3 0.3  PROT 7.6 6.2  ALBUMIN 3.4* 2.8*   No results found for this basename: LIPASE, AMYLASE,  in the last 168 hours No results found for this basename: AMMONIA,  in the last 168 hours CBC:  Recent Labs Lab 07/17/14 2136 07/18/14 0556  WBC 9.7 8.6  NEUTROABS 6.2 PENDING  HGB 13.3 12.1  HCT 41.2 37.2  MCV 92.2 92.1  PLT 318 255   Cardiac Enzymes: No results found for this basename: CKTOTAL, CKMB, CKMBINDEX, TROPONINI,  in the last 168 hours BNP (last 3 results) No results found for this basename: PROBNP,  in the last 8760 hours CBG: No results found for this basename: GLUCAP,  in the last 168 hours  No results found for this or any previous visit (from the past 240 hour(s)).   Studies: No results found.  Scheduled Meds: . enoxaparin (LOVENOX) injection  40 mg Subcutaneous Q24H  . furosemide  40 mg Oral Daily  . linezolid  600 mg Intravenous Q12H  . sodium chloride  3 mL Intravenous Q12H  . theophylline  100 mg Oral Daily  . [START ON 07/21/2014] thyroid  120 mg Oral Q Sun  . thyroid  90 mg Oral Once per day on Mon Tue Wed Thu Fri Sat   Continuous Infusions:   Principal Problem:   Cellulitis of lower extremity Active Problems:   Lymphedema   Hypothyroidism   Cellulitis of left lower extremity    Time spent: >35 minutes     Esperanza SheetsBURIEV, Lashelle Koy N  [facility] Pager [phone]. If 7PM-7AM, please contact night-coverage at [email or web address], password [email or web address] 07/18/2014, 9:30 AM  LOS: 1 day

## 2014-07-18 NOTE — Progress Notes (Signed)
Utilization review completed.  

## 2014-07-18 NOTE — Progress Notes (Signed)
ANTIBIOTIC CONSULT NOTE - INITIAL  Pharmacy Consult for vancomycin Indication: cellulitis  Allergies  Allergen Reactions  . Spinach Nausea And Vomiting  . Vancomycin Itching    Patient Measurements: Height: 5\' 3"  (160 cm) Weight: 200 lb (90.719 kg) IBW/kg (Calculated) : 52.4  Vital Signs: Temp: 97.6 F (36.4 C) (10/01 0539) BP: 95/52 mmHg (10/01 0539) Pulse Rate: 77 (10/01 0539) Intake/Output from previous day: 09/30 0701 - 10/01 0700 In: 120 [P.O.:120] Out: -  Intake/Output from this shift: Total I/O In: 480 [P.O.:480] Out: -   Labs:  Recent Labs  07/17/14 2136 07/18/14 0556  WBC 9.7 8.6  HGB 13.3 12.1  PLT 318 255  CREATININE 0.71 0.69   Estimated Creatinine Clearance: 88.9 ml/min (by C-G formula based on Cr of 0.69). No results found for this basename: VANCOTROUGH, VANCOPEAK, VANCORANDOM, GENTTROUGH, GENTPEAK, GENTRANDOM, TOBRATROUGH, TOBRAPEAK, TOBRARND, AMIKACINPEAK, AMIKACINTROU, AMIKACIN,  in the last 72 hours   Microbiology: No results found for this or any previous visit (from the past 720 hour(s)).  Medical History: Past Medical History  Diagnosis Date  . Lymphedema   . Hypothyroid   . Cellulitis 07/17/2014    LEFT LOWER LEG    Medications:  Prescriptions prior to admission  Medication Sig Dispense Refill  . furosemide (LASIX) 40 MG tablet Take 40 mg by mouth daily.      . theophylline (THEODUR) 100 MG 12 hr tablet Take 100 mg by mouth daily.      Marland Kitchen. thyroid (ARMOUR) 120 MG tablet Take 120 mg by mouth every Sunday.       . thyroid (ARMOUR) 90 MG tablet Take 90 mg by mouth See admin instructions. Takes daily except for Sundays.       [name] who presented to the ED with cellulitis on 9/30. She has failed outpatient therapy with Keflex and Augmentin. Pharmacy was originally consulted to begin daptomycin due to vancomycin allergy however itching was reaction - this is not an allergy. Spoke with Dr. York SpanielBuriev and received an order  to proceed with vancomycin. Renal function is normal, WBC are normal, and she is afebrile.  Goal of Therapy:  Vancomycin trough level 10-15 mcg/ml  Plan:  - Vancomycin 1250 mg IV q12h - Monitor renal function and clinical course  [facility] Rede[facility] Hospital & Medical CenterJennifer [name], 17[address] Rainbow BoulevardPharm.D., BCPS Clinical Pharmacist Pager: [phone] 07/18/2014 12:24 PM

## 2014-07-18 NOTE — ED Provider Notes (Signed)
CSN: [id number]     Arrival date & time 07/17/14  1604 History   First MD Initiated Contact with Patient 07/17/14 2336     Chief Complaint  Patient presents with  . Cellulitis      HPI Patient reports developing cellulitis of her left leg proximal to 2 weeks ago.  She's been on Keflex and Augmentin without improvement in her symptoms.  She feels as though the swelling in her left leg is worsening with worsening redness.  She had fevers and chills last week.  She's had no fever this week.  She has a history of cellulitis the history lymphedema.  She is allergic to vancomycin and she reports Zyvox has been required in the hospital setting before.  Her pain is mild to moderate in severity at this time.  She is not on anything else for pain currently.   Past Medical History  Diagnosis Date  . Lymphedema   . Hypothyroid    Past Surgical History  Procedure Laterality Date  . Ovarian cyst removal     Family History  Problem Relation Age of Onset  . Lung cancer Mother 5257   History  Substance Use Topics  . Smoking status: Never Smoker   . Smokeless tobacco: Not on file  . Alcohol Use: Yes   OB History   Grav Para Term Preterm Abortions TAB SAB Ect Mult Living                 Review of Systems  All other systems reviewed and are negative.     Allergies  Spinach and Vancomycin  Home Medications   Prior to Admission medications   Medication Sig Start Date End Date Taking? Authorizing Provider  furosemide (LASIX) 40 MG tablet Take 40 mg by mouth daily.   Yes Historical Provider, MD  theophylline (THEODUR) 100 MG 12 hr tablet Take 100 mg by mouth daily.   Yes Historical Provider, MD  thyroid (ARMOUR) 120 MG tablet Take 120 mg by mouth every Sunday.    Yes Historical Provider, MD  thyroid (ARMOUR) 90 MG tablet Take 90 mg by mouth See admin instructions. Takes daily except for Sundays.   Yes Historical Provider, MD   BP 98/81  Pulse 69  Temp(Src) 97.6 F (36.4 C) (Oral)   Resp 18  Ht 5\' 3"  (1.6 m)  Wt 200 lb (90.719 kg)  BMI 35.44 kg/m2  SpO2 100% Physical Exam  Nursing note and vitals reviewed. Constitutional: She is oriented to person, place, and time. She appears well-developed and well-nourished.  HENT:  Head: Normocephalic.  Eyes: EOM are normal.  Neck: Normal range of motion.  Pulmonary/Chest: Effort normal.  Abdominal: She exhibits no distension.  Musculoskeletal: Normal range of motion.  Left lower extremity swelling as compared to right.  Mild erythema of the left lower extremity with warmth.  Normal pulses in left foot.  No significant swelling of her proximal left leg as compared to her proximal right leg.  Neurological: She is alert and oriented to person, place, and time.  Psychiatric: She has a normal mood and affect.    ED Course  Procedures (including critical care time) Labs Review Labs Reviewed  COMPREHENSIVE METABOLIC PANEL - Abnormal; Notable for the following:    Albumin 3.4 (*)    All other components within normal limits  CULTURE, BLOOD (ROUTINE X 2)  CULTURE, BLOOD (ROUTINE X 2)  CBC WITH DIFFERENTIAL  I-STAT CG4 LACTIC ACID, ED    Imaging Review No results  found.   EKG Interpretation None      MDM   Final diagnoses:  Cellulitis of lower leg    Cellulitis.  Failed outpatient therapy.  Admit to the hospital.  IV Zyvox given.  Blood cultures obtained.    Lyanne Co, MD 07/18/14 [phone]

## 2014-07-19 DIAGNOSIS — R600 Localized edema: Secondary | ICD-10-CM

## 2014-07-19 NOTE — Progress Notes (Signed)
*  Preliminary Results* Left lower extremity venous duplex completed. Left lower extremity is negative for deep vein thrombosis. There is no evidence of left Baker's cyst.  07/19/2014 11:45 AM  Gertie FeyMichelle Damarien Nyman, RVT, RDCS, RDMS

## 2014-07-19 NOTE — Progress Notes (Signed)
[facility] PROGRESS NOTE  [name] [name]:[id number] DOB: 1961-08-17 DOA: 07/17/2014 PCP: Pearson Grippe, MD  Assessment/Plan: 53 y.o. female with history of chronic bilateral lower extremity lymphedema and hypothyroidism presents to the ER with worsening erythema and pain in the left lower extremity.  -Patient's symptoms started last week and was prescribed initially Keflex followed by Augmentin by patient's primary care despite which patient's symptoms did not improve and patient came to the ER.   1. Cellulitis of the left lower extremity on top of chronic lymphedema -improved on vancomycin (no allergy). Keep left leg elevated while patient is on bed. Pend Dopplers to rule out DVT.  2. Chronic lymphedema bilateral lower extremity - on Lasix.  3. History of hypothyroidism - continue thyroid replacement.  Code Status: full Family Communication: d/w patient  (indicate person spoken with, relationship, and if by phone, the number) Disposition Plan: home pend clinical improvement; 24-48 hrs    Consultants:  none  Procedures:  none  Antibiotics:  zyvox 10/1<<<< 10/1  vanc 10/1<<<  (indicate start date, and stop date if known)  HPI/Subjective: alert  Objective: Filed Vitals:   07/19/14 0539  BP: 109/61  Pulse: 68  Temp: 98.2 F (36.8 C)  Resp: 16    Intake/Output Summary (Last 24 hours) at 07/19/14 0929 Last data filed at 07/18/14 2000  Gross per 24 hour  Intake    360 ml  Output      0 ml  Net    360 ml   Filed Weights   07/17/14 1647  Weight: 90.719 kg (200 lb)    Exam:   General:  alert  Cardiovascular: s1,s2 rrr  Respiratory: CTA BL  Abdomen: soft, nt,nd   Musculoskeletal: L leg erythema, edema, warm improving    Data Reviewed: Basic Metabolic Panel:  Recent Labs Lab 07/17/14 2136 07/18/14 0556  NA 139 142  K 4.1 4.1  CL 99 105  CO2 28 27  GLUCOSE 94 91  BUN 14 10  CREATININE 0.71 0.69  CALCIUM 9.0 8.6   Liver Function  Tests:  Recent Labs Lab 07/17/14 2136 07/18/14 0556  AST 18 13  ALT 11 8  ALKPHOS 71 60  BILITOT 0.3 0.3  PROT 7.6 6.2  ALBUMIN 3.4* 2.8*   No results found for this basename: LIPASE, AMYLASE,  in the last 168 hours No results found for this basename: AMMONIA,  in the last 168 hours CBC:  Recent Labs Lab 07/17/14 2136 07/18/14 0556  WBC 9.7 8.6  NEUTROABS 6.2 5.7  HGB 13.3 12.1  HCT 41.2 37.2  MCV 92.2 92.1  PLT 318 255   Cardiac Enzymes: No results found for this basename: CKTOTAL, CKMB, CKMBINDEX, TROPONINI,  in the last 168 hours BNP (last 3 results) No results found for this basename: PROBNP,  in the last 8760 hours CBG: No results found for this basename: GLUCAP,  in the last 168 hours  Recent Results (from the past 240 hour(s))  CULTURE, BLOOD (ROUTINE X 2)     Status: None   Collection Time    07/17/14 11:55 PM      Result Value Ref Range Status   Specimen Description BLOOD RIGHT ARM   Final   Special Requests BOTTLES DRAWN AEROBIC AND ANAEROBIC 10CC   Final   Culture  Setup Time     Final   Value: 07/18/2014 09:12     Performed at [facility]   Culture     Final   Value:  BLOOD CULTURE RECEIVED NO GROWTH TO DATE CULTURE WILL BE HELD FOR 5 DAYS BEFORE ISSUING A FINAL NEGATIVE REPORT     [facility]   Report Status PENDING   Incomplete  CULTURE, BLOOD (ROUTINE X 2)     Status: None   Collection Time    07/18/14 12:00 AM      Result Value Ref Range Status   Specimen Description BLOOD RIGHT HAND   Final   Special Requests BOTTLES DRAWN AEROBIC ONLY 10CC   Final   Culture  Setup Time     Final   Value: 07/18/2014 09:12     [facility]   Culture     Final   Value:        BLOOD CULTURE RECEIVED NO GROWTH TO DATE CULTURE WILL BE HELD FOR 5 DAYS BEFORE ISSUING A FINAL NEGATIVE REPORT     [facility]   Report Status PENDING   Incomplete     Studies: No results  found.  Scheduled Meds: . enoxaparin (LOVENOX) injection  40 mg Subcutaneous Q24H  . furosemide  40 mg Oral Daily  . Influenza vac split quadrivalent PF  0.5 mL Intramuscular Tomorrow-1000  . sodium chloride  3 mL Intravenous Q12H  . theophylline  100 mg Oral Daily  . [START ON 07/21/2014] thyroid  120 mg Oral Q Sun  . thyroid  90 mg Oral Once per day on Mon Tue Wed Thu Fri Sat  . vancomycin  1,250 mg Intravenous Q12H   Continuous Infusions:   Principal Problem:   Cellulitis of lower extremity Active Problems:   Lymphedema   Hypothyroidism   Cellulitis of left lower extremity    Time spent: >35 minutes     Esperanza SheetsBURIEV, Alexx Mcburney N  [facility] Pager [phone]. If 7PM-7AM, please contact night-coverage at [email or web address], password [email or web address] 07/19/2014, 9:29 AM  LOS: 2 days

## 2014-07-20 DIAGNOSIS — L03116 Cellulitis of left lower limb: Principal | ICD-10-CM

## 2014-07-20 MED ORDER — ACETAMINOPHEN 325 MG PO TABS: 650.0000 mg | ORAL_TABLET | Freq: Four times a day (QID) | ORAL | Status: DC | PRN

## 2014-07-20 MED ORDER — DOXYCYCLINE HYCLATE 100 MG PO TABS: 100.0000 mg | ORAL_TABLET | Freq: Two times a day (BID) | ORAL | Status: AC

## 2014-07-20 MED ORDER — LEVOFLOXACIN 500 MG PO TABS: 500.0000 mg | ORAL_TABLET | Freq: Every day | ORAL | Status: AC

## 2014-07-20 MED ORDER — LEVOFLOXACIN 500 MG PO TABS: 500.0000 mg | ORAL_TABLET | Freq: Every day | ORAL | Status: DC

## 2014-07-20 MED ORDER — ACETAMINOPHEN 325 MG PO TABS: 650.0000 mg | ORAL_TABLET | Freq: Four times a day (QID) | ORAL | Status: AC | PRN

## 2014-07-20 MED ORDER — DOXYCYCLINE HYCLATE 100 MG PO TABS: 100.0000 mg | ORAL_TABLET | Freq: Two times a day (BID) | ORAL | Status: DC

## 2014-07-20 NOTE — Discharge Summary (Signed)
Physician Discharge Summary  [name] [name] ZOX:[id number] DOB: 1960-12-20 DOA: 07/17/2014  PCP: Pearson GrippeKIM, JAMES, MD  Admit date: 07/17/2014 Discharge date: 07/20/2014  Time spent: >35 minutes  Recommendations for Outpatient Follow-up:  F/u with PCP in 1 week   Discharge Diagnoses:  Principal Problem:   Cellulitis of lower extremity Active Problems:   Lymphedema   Hypothyroidism   Cellulitis of left lower extremity   Discharge Condition: stable   Diet recommendation: regular   Filed Weights   07/17/14 1647  Weight: 90.719 kg (200 lb)    History of present illness:   53 y.o. female with history of chronic bilateral lower extremity lymphedema and hypothyroidism presents to the ER with worsening erythema and pain in the left lower extremity.  -Patient's symptoms started last week and was prescribed initially Keflex followed by Augmentin by patient's primary care despite which patient's symptoms did not improve and patient came to the ER.   Hospital Course:  1. Cellulitis of the left lower extremity on top of chronic lymphedema  -improved on vancomycin (no allergy). Keep left leg elevated while patient is on bed. Neg Dopplers for DVT.  -changed atx to doxycycline+levoflox to complete outpatient treatment course  2. Chronic lymphedema bilateral lower extremity - on Lasix.  3. History of hypothyroidism - continue thyroid replacement.      Procedures:  none (i.e. Studies not automatically included, echos, thoracentesis, etc; not x-rays)  Consultations:  none  Discharge Exam: Filed Vitals:   07/20/14 0546  BP: 105/52  Pulse: 64  Temp: 99.1 F (37.3 C)  Resp: 16    General: alert Cardiovascular: s1,s2 rrr Respiratory: CTA BL  Discharge Instructions  Discharge Instructions   Diet - low sodium heart healthy    Complete by:  As directed      Discharge instructions    Complete by:  As directed   Please follow up with primary care doctor in 1 week     Increase  activity slowly    Complete by:  As directed             Medication List         acetaminophen 325 MG tablet  Commonly known as:  TYLENOL  Take 2 tablets (650 mg total) by mouth every 6 (six) hours as needed for mild pain (or Fever >/= 101).     doxycycline 100 MG tablet  Commonly known as:  VIBRA-TABS  Take 1 tablet (100 mg total) by mouth 2 (two) times daily.     furosemide 40 MG tablet  Commonly known as:  LASIX  Take 40 mg by mouth daily.     levofloxacin 500 MG tablet  Commonly known as:  LEVAQUIN  Take 1 tablet (500 mg total) by mouth daily.     theophylline 100 MG 12 hr tablet  Commonly known as:  THEODUR  Take 100 mg by mouth daily.     thyroid 90 MG tablet  Commonly known as:  ARMOUR  Take 90 mg by mouth See admin instructions. Takes daily except for Sundays.     thyroid 120 MG tablet  Commonly known as:  ARMOUR  Take 120 mg by mouth every Sunday.       Allergies  Allergen Reactions  . Spinach Nausea And Vomiting  . Vancomycin Itching       Follow-up Information   Follow up with Pearson GrippeKIM, JAMES, MD In 1 week.   Specialty:  Internal Medicine   Contact information:   [address]  [address] [place] [address] [phone]        The results of significant diagnostics from this hospitalization (including imaging, microbiology, ancillary and laboratory) are listed below for reference.    Significant Diagnostic Studies: No results found.  Microbiology: Recent Results (from the past 240 hour(s))  CULTURE, BLOOD (ROUTINE X 2)     Status: None   Collection Time    07/17/14 11:55 PM      Result Value Ref Range Status   Specimen Description BLOOD RIGHT ARM   Final   Special Requests BOTTLES DRAWN AEROBIC AND ANAEROBIC 10CC   Final   Culture  Setup Time     Final   Value: 07/18/2014 09:12     Performed at [facility]   Culture     Final   Value:        BLOOD CULTURE RECEIVED NO GROWTH TO DATE CULTURE WILL BE HELD FOR 5 DAYS BEFORE  ISSUING A FINAL NEGATIVE REPORT     Performed at [facility]   Report Status PENDING   Incomplete  CULTURE, BLOOD (ROUTINE X 2)     Status: None   Collection Time    07/18/14 12:00 AM      Result Value Ref Range Status   Specimen Description BLOOD RIGHT HAND   Final   Special Requests BOTTLES DRAWN AEROBIC ONLY 10CC   Final   Culture  Setup Time     Final   Value: 07/18/2014 09:12     Performed at [facility]   Culture     Final   Value:        BLOOD CULTURE RECEIVED NO GROWTH TO DATE CULTURE WILL BE HELD FOR 5 DAYS BEFORE ISSUING A FINAL NEGATIVE REPORT     Performed at [facility]   Report Status PENDING   Incomplete     Labs: Basic Metabolic Panel:  Recent Labs Lab 07/17/14 2136 07/18/14 0556  NA 139 142  K 4.1 4.1  CL 99 105  CO2 28 27  GLUCOSE 94 91  BUN 14 10  CREATININE 0.71 0.69  CALCIUM 9.0 8.6   Liver Function Tests:  Recent Labs Lab 07/17/14 2136 07/18/14 0556  AST 18 13  ALT 11 8  ALKPHOS 71 60  BILITOT 0.3 0.3  PROT 7.6 6.2  ALBUMIN 3.4* 2.8*   No results found for this basename: LIPASE, AMYLASE,  in the last 168 hours No results found for this basename: AMMONIA,  in the last 168 hours CBC:  Recent Labs Lab 07/17/14 2136 07/18/14 0556  WBC 9.7 8.6  NEUTROABS 6.2 5.7  HGB 13.3 12.1  HCT 41.2 37.2  MCV 92.2 92.1  PLT 318 255   Cardiac Enzymes: No results found for this basename: CKTOTAL, CKMB, CKMBINDEX, TROPONINI,  in the last 168 hours BNP: BNP (last 3 results) No results found for this basename: PROBNP,  in the last 8760 hours CBG: No results found for this basename: GLUCAP,  in the last 168 hours     Signed:  Esperanza Sheets  [facility] 07/20/2014, 8:50 AM

## 2014-07-24 LAB — CULTURE, BLOOD (ROUTINE X 2)
CULTURE: NO GROWTH
Culture: NO GROWTH

## 2018-07-04 ENCOUNTER — Other Ambulatory Visit: Payer: Self-pay | Admitting: Internal Medicine

## 2018-07-04 DIAGNOSIS — Z1239 Encounter for other screening for malignant neoplasm of breast: Secondary | ICD-10-CM

## 2018-07-14 ENCOUNTER — Ambulatory Visit (INDEPENDENT_AMBULATORY_CARE_PROVIDER_SITE_OTHER): Payer: BLUE CROSS/BLUE SHIELD

## 2018-07-14 DIAGNOSIS — Z1231 Encounter for screening mammogram for malignant neoplasm of breast: Secondary | ICD-10-CM | POA: Diagnosis not present

## 2018-07-14 DIAGNOSIS — Z1239 Encounter for other screening for malignant neoplasm of breast: Secondary | ICD-10-CM

## 2020-04-23 IMAGING — MG DIGITAL SCREENING BILATERAL MAMMOGRAM WITH TOMO AND CAD
6 of 10 series · 6 of 30 positions shown · non-contrast
Comparison: Previous exam(s).

CLINICAL DATA: Screening.

EXAM:
DIGITAL SCREENING BILATERAL MAMMOGRAM WITH TOMO AND CAD

[R MLO synth-2D (1 of 2)]
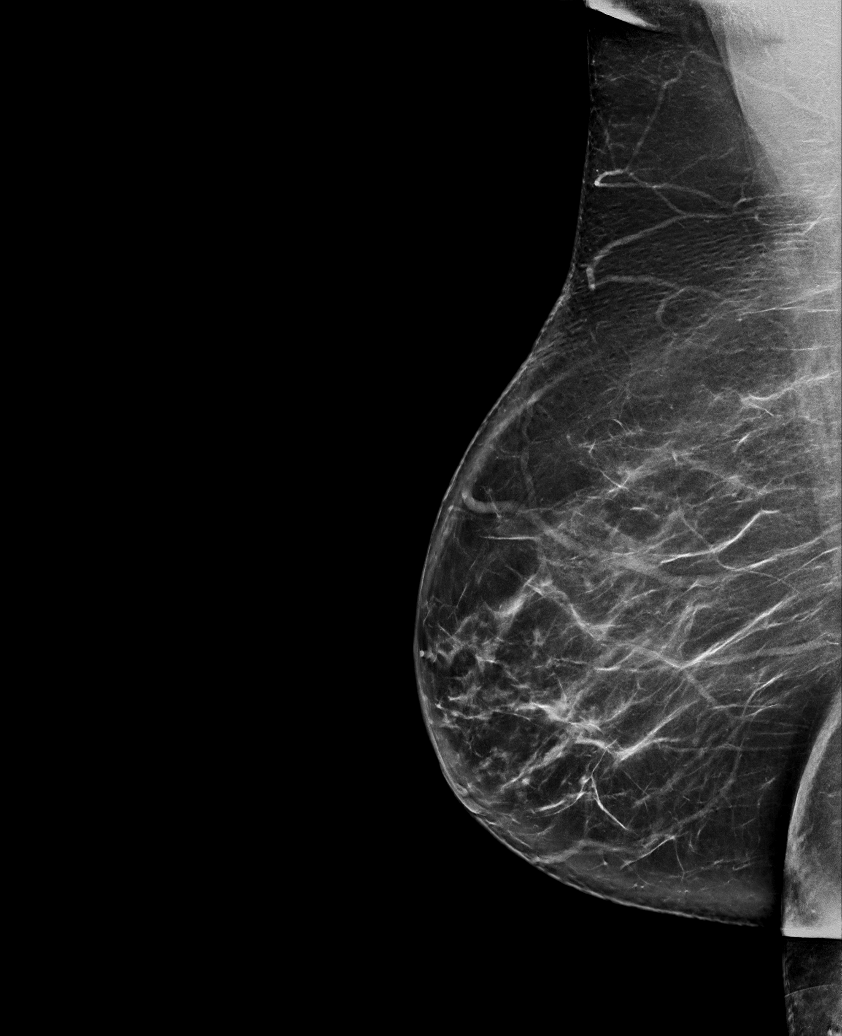

[R MLO synth-2D (2 of 2)]
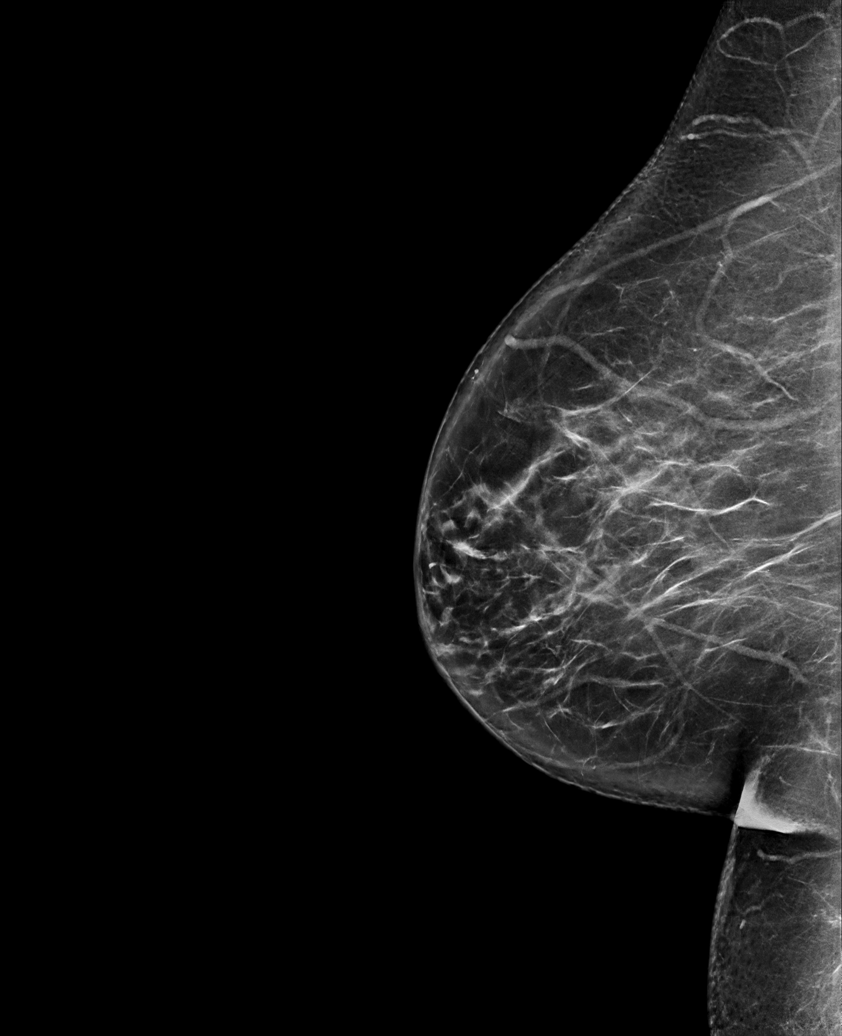

[R CC synth-2D]
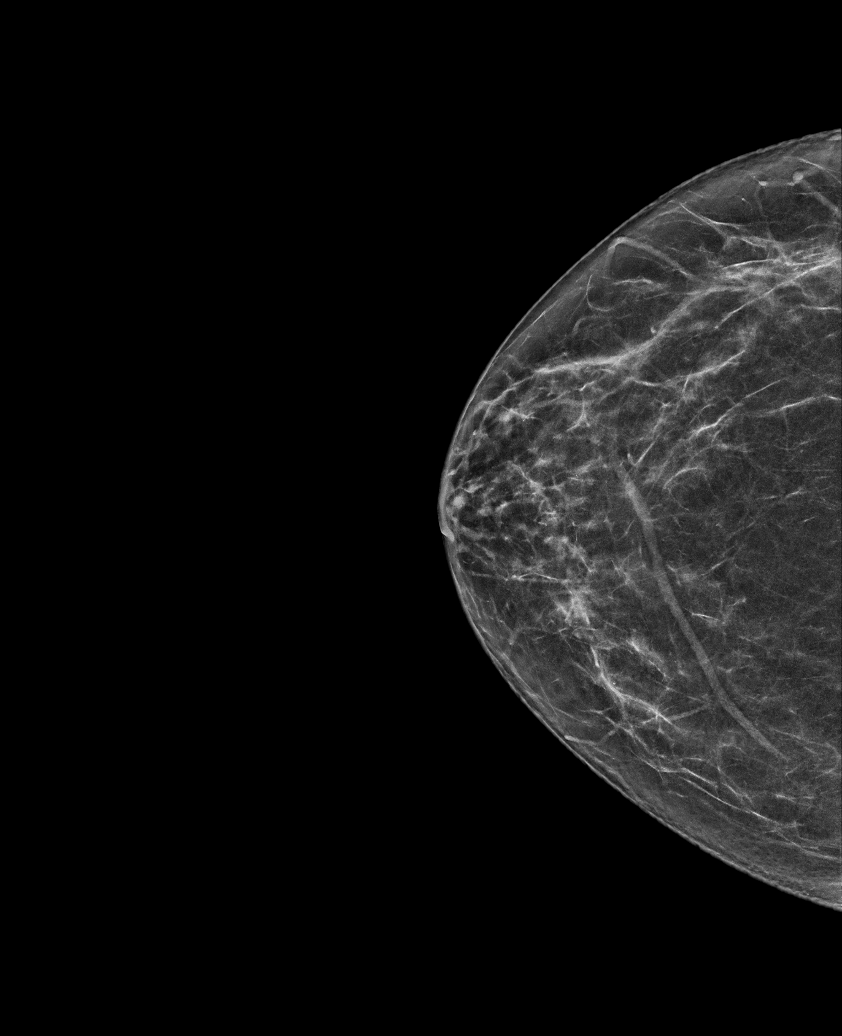

[L MLO synth-2D]
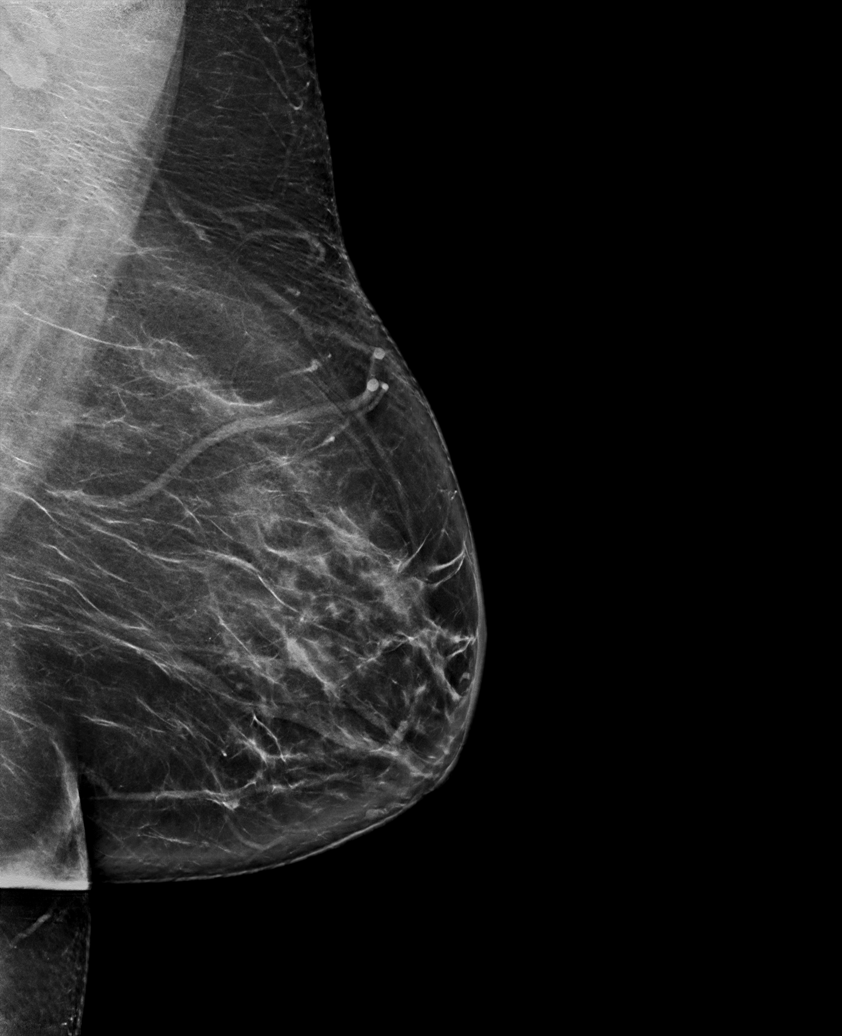

[L CC synth-2D]
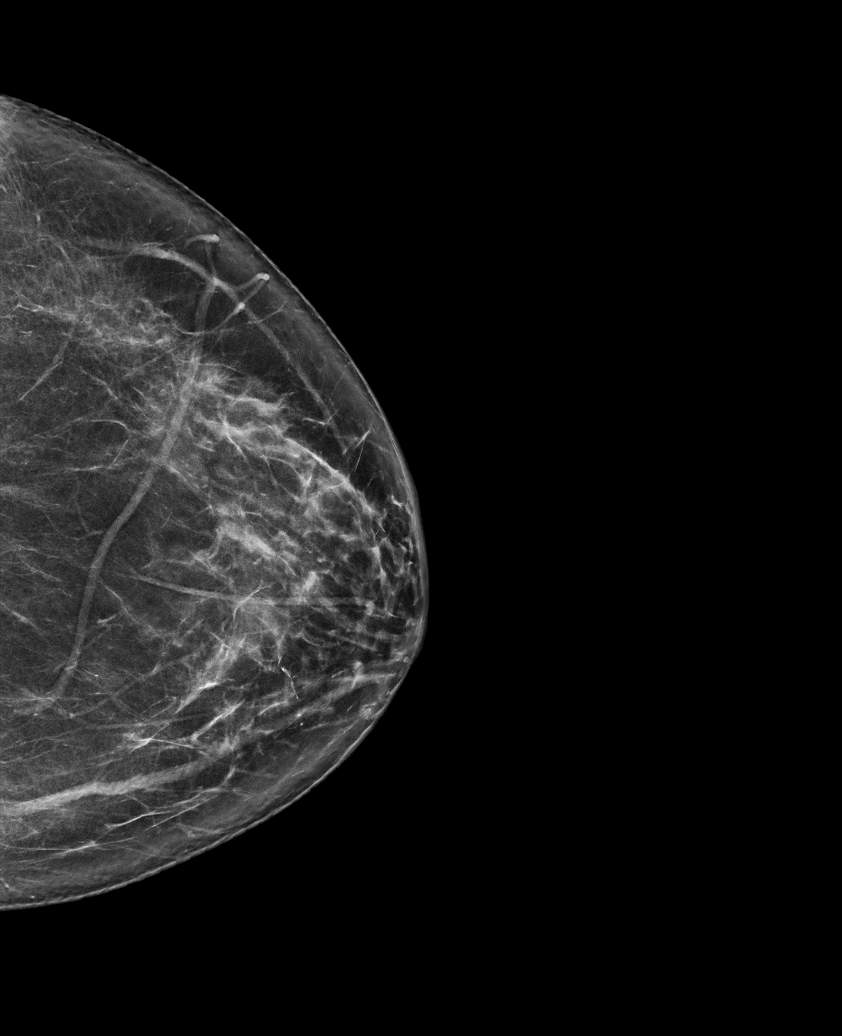

[L CC tomo · tomo slice 37/74.0]
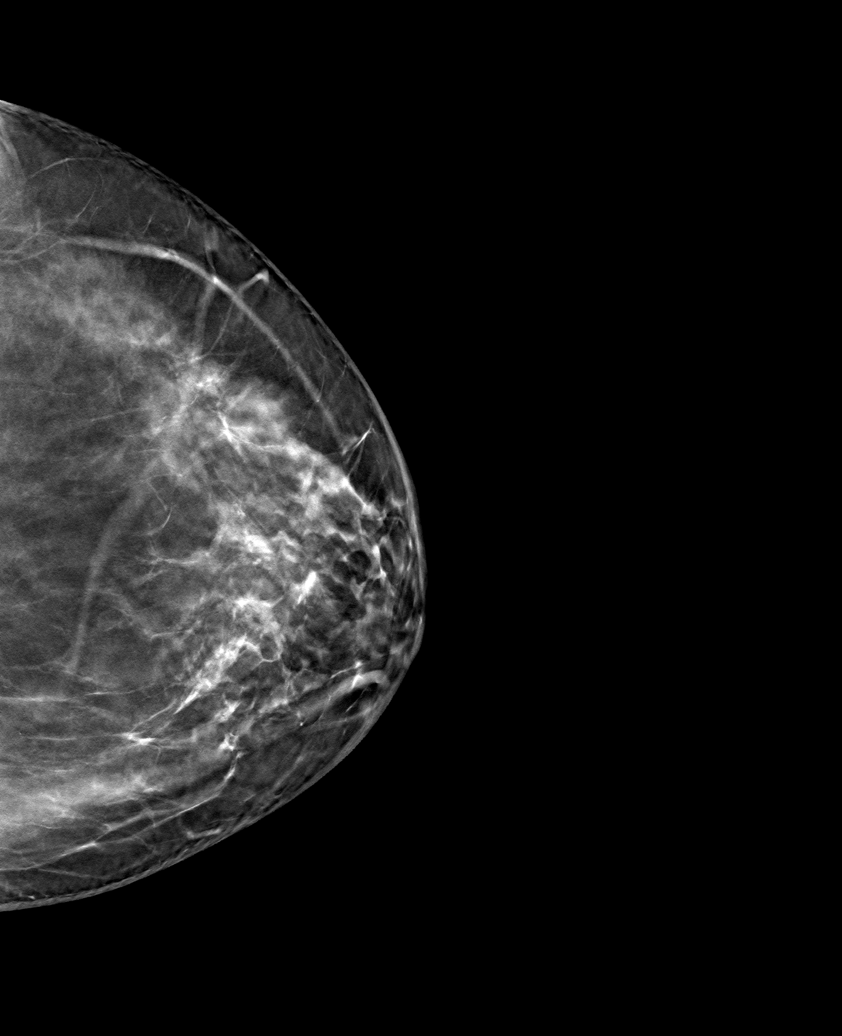

[6 of 30 positions shown; findings below may reference images not displayed]

ACR Breast Density Category b: There are scattered areas of
fibroglandular density.
FINDINGS: There are no findings suspicious for malignancy. Images were
processed with CAD.
IMPRESSION: No mammographic evidence of malignancy. A result letter of this
screening mammogram will be mailed directly to the patient.

RECOMMENDATION:
Screening mammogram in one year. (Code:[id number])

BI-RADS CATEGORY  1: Negative.
